# Patient Record
Sex: Female | Born: 1980 | Race: White | Hispanic: No | Marital: Married | Smoking: Never smoker
Health system: Southern US, Community
[De-identification: ages and names within clinical notes are randomized; demographics above are authoritative.]

## PROBLEM LIST (undated history)

## (undated) DIAGNOSIS — K219 Gastro-esophageal reflux disease without esophagitis: Secondary | ICD-10-CM

## (undated) DIAGNOSIS — K589 Irritable bowel syndrome without diarrhea: Secondary | ICD-10-CM

## (undated) HISTORY — DX: Gastro-esophageal reflux disease without esophagitis: K21.9

## (undated) HISTORY — PX: MANDIBLE SURGERY: SHX707

## (undated) HISTORY — PX: INTESTINAL MALROTATION REPAIR: SHX411

## (undated) HISTORY — PX: KNEE SURGERY: SHX244

---

## 2009-03-08 ENCOUNTER — Encounter: Admission: RE | Admit: 2009-03-08 | Discharge: 2009-03-08 | Payer: Self-pay | Admitting: Obstetrics and Gynecology

## 2010-02-16 ENCOUNTER — Inpatient Hospital Stay (HOSPITAL_COMMUNITY): Admission: AD | Admit: 2010-02-16 | Payer: Self-pay | Source: Home / Self Care | Admitting: Obstetrics and Gynecology

## 2010-03-08 ENCOUNTER — Telehealth (INDEPENDENT_AMBULATORY_CARE_PROVIDER_SITE_OTHER): Payer: Self-pay | Admitting: *Deleted

## 2010-03-08 ENCOUNTER — Ambulatory Visit: Payer: Commercial Indemnity | Admitting: Family Medicine

## 2010-03-08 ENCOUNTER — Ambulatory Visit (INDEPENDENT_AMBULATORY_CARE_PROVIDER_SITE_OTHER): Payer: Commercial Indemnity | Admitting: Emergency Medicine

## 2010-03-08 ENCOUNTER — Encounter: Payer: Self-pay | Admitting: Emergency Medicine

## 2010-03-08 DIAGNOSIS — M25569 Pain in unspecified knee: Secondary | ICD-10-CM | POA: Insufficient documentation

## 2010-03-10 ENCOUNTER — Encounter (HOSPITAL_COMMUNITY)
Admission: RE | Admit: 2010-03-10 | Discharge: 2010-03-10 | Disposition: A | Discharge status: Home / Self Care | Payer: Managed Care, Other (non HMO) | Source: Ambulatory Visit | Attending: Orthopedic Surgery | Admitting: Orthopedic Surgery

## 2010-03-10 DIAGNOSIS — Z01812 Encounter for preprocedural laboratory examination: Secondary | ICD-10-CM | POA: Insufficient documentation

## 2010-03-10 LAB — CBC
HCT: 31.6 % — ABNORMAL LOW (ref 36.0–46.0)
Hemoglobin: 10.8 g/dL — ABNORMAL LOW (ref 12.0–15.0)
MCH: 32.1 pg (ref 26.0–34.0)
MCV: 94 fL (ref 78.0–100.0)
Platelets: 196 10*3/uL (ref 150–400)
RBC: 3.36 MIL/uL — ABNORMAL LOW (ref 3.87–5.11)
WBC: 9.4 10*3/uL (ref 4.0–10.5)

## 2010-03-11 ENCOUNTER — Ambulatory Visit (HOSPITAL_COMMUNITY)
Admission: RE | Admit: 2010-03-11 | Discharge: 2010-03-11 | Disposition: A | Discharge status: Home / Self Care | Payer: Managed Care, Other (non HMO) | Source: Ambulatory Visit | Attending: Orthopedic Surgery | Admitting: Orthopedic Surgery

## 2010-03-11 ENCOUNTER — Ambulatory Visit (HOSPITAL_COMMUNITY)
Admission: RE | Admit: 2010-03-11 | Payer: Commercial Indemnity | Source: Ambulatory Visit | Admitting: Orthopedic Surgery

## 2010-03-11 DIAGNOSIS — M2240 Chondromalacia patellae, unspecified knee: Secondary | ICD-10-CM | POA: Insufficient documentation

## 2010-03-11 DIAGNOSIS — S83259A Bucket-handle tear of lateral meniscus, current injury, unspecified knee, initial encounter: Secondary | ICD-10-CM | POA: Insufficient documentation

## 2010-03-11 DIAGNOSIS — O99891 Other specified diseases and conditions complicating pregnancy: Secondary | ICD-10-CM | POA: Insufficient documentation

## 2010-03-15 NOTE — Progress Notes (Signed)
  Phone Note Outgoing Call   Call placed by: Lajean Saver RN,  March 08, 2010 11:44 AM Call placed to: Sports Med Summary of Call: Appointment made for today at 3pm with Dr. Pearletha Forge.

## 2010-03-15 NOTE — Assessment & Plan Note (Signed)
Summary: LEFT KNEE INJURY?NH (rm 4)   Vital Signs:  Patient Profile:   30 Years Old Female CC:      left knee pain x this AM Height:     66 inches Weight:      145 pounds O2 Sat:      100 % O2 treatment:    Room Air Temp:     97.8 degrees F oral Pulse rate:   92 / minute Resp:     16 per minute BP sitting:   90 / 60  (right arm) Cuff size:   regular  Pt. in pain?   yes    Location:   left knee    Intensity:   7    Type:       sharp  Vitals Entered By: Burnard Hawthorne RN (March 08, 2010 10:08 AM)                   Updated Prior Medication List: ACIPHEX 20 MG TBEC (RABEPRAZOLE SODIUM) once daily * PROBIOTIC   Current Allergies: ! * MINOCYCLINEHistory of Present Illness History from: patient Chief Complaint: left knee pain x this AM History of Present Illness: L knee injury this morning.  Was doing a lunging stretch with the L leg foreward and bent.  She feels like the knee "popped out of place".  This has occurred in the past but this time is worse.  Some popping and tightness, no locking or giving way.  No swelling or bruising.  Not using any OTC meds.  She did arrive in a wheelchair. *She is 70mo pregnant and will not get an Xray.  REVIEW OF SYSTEMS Constitutional Symptoms      Denies fever, chills, night sweats, weight loss, weight gain, and fatigue.  Eyes       Denies change in vision, eye pain, eye discharge, glasses, contact lenses, and eye surgery. Ear/Nose/Throat/Mouth       Denies hearing loss/aids, change in hearing, ear pain, ear discharge, dizziness, frequent runny nose, frequent nose bleeds, sinus problems, sore throat, hoarseness, and tooth pain or bleeding.  Respiratory       Denies dry cough, productive cough, wheezing, shortness of breath, asthma, bronchitis, and emphysema/COPD.  Cardiovascular       Denies murmurs, chest pain, and tires easily with exhertion.    Gastrointestinal       Denies stomach pain, nausea/vomiting, diarrhea, constipation,  blood in bowel movements, and indigestion. Genitourniary       Denies painful urination, kidney stones, and loss of urinary control. Neurological       Denies paralysis, seizures, and fainting/blackouts. Musculoskeletal       Complains of joint pain, decreased range of motion, and redness.      Denies muscle pain, joint stiffness, swelling, muscle weakness, and gout.      Comments: left knee Skin       Denies bruising, unusual mles/lumps or sores, and hair/skin or nail changes.  Psych       Denies mood changes, temper/anger issues, anxiety/stress, speech problems, depression, and sleep problems. Other Comments: Patient was stretching this AM with her left knee bent when she felt it "pop" with sudden pain.    Past History:  Past Medical History: 6 months gestation  Past Surgical History: Jaw Sx 2005 Stomach Surgery 2006 ([facility])  Family History: Family History Diabetes 1st degree relative- father  Social History: Never Smoked Alcohol use-no Drug use-no Smoking Status:  never Drug Use:  no Physical Exam General  appearance: well developed, well nourished, mild distress, sitting in wheelchair MSE: oriented to time, place, and person L knee: ROM is difficult to assess due to her pain tolerance, no effusion, no ecchymoses, Lachmans normal, Anterior & posterior drawer normal, McMurrays cannot be asseses, Varus & valgus stress normal.  Patella freely mobile, Clarks compression test normal.  Good alignment. No joint line tenderness.  There is some lateral patellar facet tenderness as well as lateral femoral condyle tenderness. Assessment New Problems: KNEE PAIN (ICD-719.46) FAMILY HISTORY DIABETES 1ST DEGREE RELATIVE (ICD-V18.0)   Plan New Orders: New Patient Level III [99203] Knee Immobilizer any size [L1830] Planning Comments:   Cannot Xray due to pregnancy.  Exam is limited due to patient cooperation.  However, I feel that she subluxed her patella and by doing so, has a  patellar and femoral contusion.  I placed her in a straight leg brace today and sent her directly to Dr. Pearletha Forge for evaluation.  Will likely need PT + NSAIDs + patellar brace with lateral support.  Ice, rest, and elevation until then.   The patient and/or caregiver has been counseled thoroughly with regard to medications prescribed including dosage, schedule, interactions, rationale for use, and possible side effects and they verbalize understanding.  Diagnoses and expected course of recovery discussed and will return if not improved as expected or if the condition worsens. Patient and/or caregiver verbalized understanding.   Orders Added: 1)  New Patient Level III [16109] 2)  Knee Immobilizer any size [L1830]

## 2010-03-24 NOTE — Op Note (Signed)
Rachael Dean, Rachael Dean             ACCOUNT NO.:  [id number]  MEDICAL RECORD NO.:  [id number]           PATIENT TYPE:  O  LOCATION:  [facility]                         FACILITY:  [facility]  PHYSICIAN:  Harvie Junior, M.D.   DATE OF BIRTH:  [birth year]  DATE OF PROCEDURE:  03/11/2010 DATE OF DISCHARGE:  03/11/2010                              OPERATIVE REPORT   PREOPERATIVE DIAGNOSIS:  Lateral bucket-handle meniscal tear.  POSTOPERATIVE DIAGNOSES: 1. Lateral bucket-handle meniscal tear. 2. Chondromalacia of patella.  PRINCIPAL PROCEDURE: 1. Partial lateral meniscectomy with debridement of lateral bucket-     handle tear. 2. Debridement of chondromalacia of patella.  SURGEON:  Harvie Junior, MD  ASSISTANT:  Marshia Ly, PA  ANESTHESIA:  General.  BRIEF HISTORY:  Ms. Lienhard is a 30 year old female with long history of having had a twisting injury to her knee where she felt the knee locking and catch.  She was unable to move the knee following this.  Because of continued complaints of pain, ultrasound was obtained which showed she had a displaced bucket-handle tear.  Once that was obtained, we talked to her about treatment options including observation.  She had inability to stand, inability to walk.  Because of continued complaints of severe and significant pain, ultimately she was taken to the operating room for operative knee arthroscopy.  She was [redacted] weeks pregnant, so we talked her obstetrician about this, we talked anesthesia and had a protocol they ran.  We discussed waiting.  Because of high level of misery, we felt that she needed to undergo this surgical procedure.  She was brought to the operating room for this procedure.  PROCEDURE:  The patient was brought to the operating room.  After adequate anesthesia was obtained with general anesthetic, the patient was placed supine on the operating room table.  The left leg was prepped and draped in the usual sterile fashion.   Following this, routine arthroscopic examination of the knee revealed there was a little bit of chondromalacia of patella which was debrided back to smooth and stable rim.  Attention was then turned to the medial compartment which was normal.  ACL was normal.  Lateral compartment showed a lateral bucket- handle tear.  We could move this back into a reduced position.  There was really no significant blood on the back piece and I think it had been more of a chronic tear.  There were some portions of the meniscus which were sort of bead up.  At that point, we ultimately elected for meniscal resection.  We took a straight biter and straight back to the back portion of the meniscus and then at the mid body straight back at that point and then removed that piece in total.  Remaining meniscal rim was contoured down with a suction shaver.  At this point, the knee was milked in the back to remove all meniscal fragments.  The knee was now unlocked.  The knee moved freely at this point.  At this point, the attention was turned back in the lateral gutter and up and around the patellofemoral joint.  The knee was then copiously  and thoroughly lavaged, suctioned dried.  Arthroscopic portals were closed with bandage and a small stitch on the lateral side.  The patient was then taken to recovery room and was noted to be in satisfactory condition.  Estimated blood loss for the procedure was none.     Harvie Junior, M.D.     Ranae Plumber  D:  03/11/2010  T:  03/12/2010  Job:  [id number]  Electronically Signed by Jodi Geralds M.D. on 03/23/2010 01:28:15 PM

## 2010-06-29 ENCOUNTER — Inpatient Hospital Stay (HOSPITAL_COMMUNITY): Admission: AD | Admit: 2010-06-29 | Payer: Self-pay | Source: Ambulatory Visit | Admitting: Obstetrics and Gynecology

## 2010-07-04 ENCOUNTER — Encounter (HOSPITAL_COMMUNITY): Payer: Self-pay | Admitting: *Deleted

## 2010-07-04 ENCOUNTER — Inpatient Hospital Stay (HOSPITAL_COMMUNITY)
Admission: AD | Admit: 2010-07-04 | Discharge: 2010-07-07 | DRG: 775 | Disposition: A | Discharge status: Home / Self Care | Payer: Managed Care, Other (non HMO) | Source: Ambulatory Visit | Attending: Obstetrics and Gynecology | Admitting: Obstetrics and Gynecology

## 2010-07-04 DIAGNOSIS — O480 Post-term pregnancy: Principal | ICD-10-CM | POA: Diagnosis present

## 2010-07-04 LAB — CBC
MCV: 93.6 fL (ref 78.0–100.0)
Platelets: 206 10*3/uL (ref 150–400)
RDW: 13.5 % (ref 11.5–15.5)

## 2010-07-05 LAB — RPR: RPR Ser Ql: NONREACTIVE

## 2010-07-06 LAB — ABO/RH: ABO/RH(D): O POS

## 2010-07-07 LAB — CBC
MCH: 32.2 pg (ref 26.0–34.0)
RBC: 3.45 MIL/uL — ABNORMAL LOW (ref 3.87–5.11)
WBC: 14.9 10*3/uL — ABNORMAL HIGH (ref 4.0–10.5)

## 2012-11-01 LAB — OB RESULTS CONSOLE ABO/RH: RH Type: POSITIVE

## 2012-11-01 LAB — OB RESULTS CONSOLE RPR: RPR: NONREACTIVE

## 2012-11-01 LAB — OB RESULTS CONSOLE HEPATITIS B SURFACE ANTIGEN: Hepatitis B Surface Ag: NEGATIVE

## 2012-11-01 LAB — OB RESULTS CONSOLE GC/CHLAMYDIA
CHLAMYDIA, DNA PROBE: NEGATIVE
Gonorrhea: NEGATIVE

## 2012-11-01 LAB — OB RESULTS CONSOLE RUBELLA ANTIBODY, IGM: Rubella: IMMUNE

## 2012-11-01 LAB — OB RESULTS CONSOLE ANTIBODY SCREEN: ANTIBODY SCREEN: NEGATIVE

## 2012-11-01 LAB — OB RESULTS CONSOLE HIV ANTIBODY (ROUTINE TESTING): HIV: NONREACTIVE

## 2013-01-16 NOTE — L&D Delivery Note (Signed)
Delivery Note At 12:49 PM a viable and healthy female was delivered via Vaginal, Spontaneous Delivery (Presentation: Right Occiput Anterior).  APGAR: 8, 9; weight .  pending Placenta status: Intact, Spontaneous. Not sent Cord:  with the following complications: None.  Cord pH: none  Anesthesia: Epidural Local  Episiotomy: None Lacerations: 1st degree;Perineal;Labial majus( perineal area overall mildly avulsed from vagina) Suture Repair: 3.0 chromic Est. Blood Loss (mL): 250  Mom to postpartum.  Baby to Couplet care / Skin to Skin.  Honore Wipperfurth A Rickia Freeburg 06/01/2013, 1:16 PM

## 2013-02-20 ENCOUNTER — Encounter (HOSPITAL_COMMUNITY): Payer: Self-pay | Admitting: Emergency Medicine

## 2013-02-20 ENCOUNTER — Emergency Department (HOSPITAL_COMMUNITY)
Admission: EM | Admit: 2013-02-20 | Discharge: 2013-02-20 | Disposition: A | Discharge status: Home / Self Care | Payer: BC Managed Care – PPO | Attending: Emergency Medicine | Admitting: Emergency Medicine

## 2013-02-20 ENCOUNTER — Emergency Department (HOSPITAL_COMMUNITY): Payer: BC Managed Care – PPO

## 2013-02-20 DIAGNOSIS — J40 Bronchitis, not specified as acute or chronic: Secondary | ICD-10-CM

## 2013-02-20 DIAGNOSIS — O9989 Other specified diseases and conditions complicating pregnancy, childbirth and the puerperium: Secondary | ICD-10-CM | POA: Insufficient documentation

## 2013-02-20 DIAGNOSIS — Z87891 Personal history of nicotine dependence: Secondary | ICD-10-CM | POA: Insufficient documentation

## 2013-02-20 DIAGNOSIS — R079 Chest pain, unspecified: Secondary | ICD-10-CM | POA: Insufficient documentation

## 2013-02-20 MED ORDER — BENZONATATE 100 MG PO CAPS: 100.0000 mg | ORAL_CAPSULE | Freq: Two times a day (BID) | ORAL | Status: DC | PRN

## 2013-02-20 MED ORDER — AEROCHAMBER Z-STAT PLUS/MEDIUM MISC
1.0000 | Freq: Once | Status: AC
  Administered 2013-02-20: 1
  Filled 2013-02-20: qty 1

## 2013-02-20 MED ORDER — PREDNISONE 20 MG PO TABS
60.0000 mg | ORAL_TABLET | Freq: Once | ORAL | Status: AC
  Administered 2013-02-20: 60 mg via ORAL
  Filled 2013-02-20: qty 3

## 2013-02-20 MED ORDER — PREDNISONE 20 MG PO TABS: 40.0000 mg | ORAL_TABLET | Freq: Every day | ORAL | Status: DC

## 2013-02-20 MED ORDER — ALBUTEROL SULFATE HFA 108 (90 BASE) MCG/ACT IN AERS
2.0000 | INHALATION_SPRAY | RESPIRATORY_TRACT | Status: DC | PRN
  Administered 2013-02-20: 2 via RESPIRATORY_TRACT
  Filled 2013-02-20: qty 6.7

## 2013-02-20 MED ORDER — BENZONATATE 100 MG PO CAPS
200.0000 mg | ORAL_CAPSULE | Freq: Once | ORAL | Status: AC
  Administered 2013-02-20: 200 mg via ORAL
  Filled 2013-02-20: qty 2

## 2013-02-20 NOTE — ED Notes (Signed)
Pt has had cough for 4 weeks.  Pt started on antibiotic on Friday.  Pt feels better but cough remains.  Unknown for fever.  Pt states generalized body aches at home.  Dry cough.

## 2013-02-20 NOTE — ED Provider Notes (Signed)
  Face-to-face evaluation   History: She's been on for 4 weeks with a nonproductive cough. She's never been a smoker, her current pregnancy is uncomplicated.  Physical exam: Alert, calm, occasional cough. Lungs have somewhat decreased air movement bilaterally, but notable wheezes, rales, or rhonchi. Chest wall is mildly tender left anterior.  Medical screening examination/treatment/procedure(s) were conducted as a shared visit with non-physician practitioner(s) and myself.  I personally evaluated the patient during the encounter  Flint MelterElliott L Doria Fern, MD 02/22/13 [phone]

## 2013-02-20 NOTE — ED Notes (Signed)
Pt being sent by Cherly Hensenousins MD for coughing and chest pain x "over 4 weeks."  MD's nurse sts "Pt sounds junky.  The cough is causing chest pain and she is incontinent when she coughs."  Pt is [redacted]wks pregnant.

## 2013-02-20 NOTE — ED Notes (Signed)
Positive fetal heart tones. Unable to count due to doppler available in the ED. EDPA made aware.

## 2013-02-20 NOTE — Discharge Instructions (Signed)
Take 2 puffs of albuterol every 4 hours for cough  Take prednisone (steroid) for 5 days  Take Tessalon twice daily as needed for cough  Follow-up with your doctor on Monday  Return to the emergency department if you develop any changing/worsening condition, chest pain, difficulty breathing, coughing up blood, leg swelling, abdominal pain, vaginal bleeding, or any other concerns (please read additional information regarding your condition below)   Bronchitis Bronchitis is inflammation of the airways that extend from the windpipe into the lungs (bronchi). The inflammation often causes mucus to develop, which leads to a cough. If the inflammation becomes severe, it may cause shortness of breath. CAUSES  Bronchitis may be caused by:   Viral infections.   Bacteria.   Cigarette smoke.   Allergens, pollutants, and other irritants.  SIGNS AND SYMPTOMS  The most common symptom of bronchitis is a frequent cough that produces mucus. Other symptoms include:  Fever.   Body aches.   Chest congestion.   Chills.   Shortness of breath.   Sore throat.  DIAGNOSIS  Bronchitis is usually diagnosed through a medical history and physical exam. Tests, such as chest X-rays, are sometimes done to rule out other conditions.  TREATMENT  You may need to avoid contact with whatever caused the problem (smoking, for example). Medicines are sometimes needed. These may include:  Antibiotics. These may be prescribed if the condition is caused by bacteria.  Cough suppressants. These may be prescribed for relief of cough symptoms.   Inhaled medicines. These may be prescribed to help open your airways and make it easier for you to breathe.   Steroid medicines. These may be prescribed for those with recurrent (chronic) bronchitis. HOME CARE INSTRUCTIONS  Get plenty of rest.   Drink enough fluids to keep your urine clear or pale yellow (unless you have a medical condition that requires fluid  restriction). Increasing fluids may help thin your secretions and will prevent dehydration.   Only take over-the-counter or prescription medicines as directed by your health care provider.  Only take antibiotics as directed. Make sure you finish them even if you start to feel better.  Avoid secondhand smoke, irritating chemicals, and strong fumes. These will make bronchitis worse. If you are a smoker, quit smoking. Consider using nicotine gum or skin patches to help control withdrawal symptoms. Quitting smoking will help your lungs heal faster.   Put a cool-mist humidifier in your bedroom at night to moisten the air. This may help loosen mucus. Change the water in the humidifier daily. You can also run the hot water in your shower and sit in the bathroom with the door closed for 5 10 minutes.   Follow up with your health care provider as directed.   Wash your hands frequently to avoid catching bronchitis again or spreading an infection to others.  SEEK MEDICAL CARE IF: Your symptoms do not improve after 1 week of treatment.  SEEK IMMEDIATE MEDICAL CARE IF:  Your fever increases.  You have chills.   You have chest pain.   You have worsening shortness of breath.   You have bloody sputum.  You faint.  You have lightheadedness.  You have a severe headache.   You vomit repeatedly. MAKE SURE YOU:   Understand these instructions.  Will watch your condition.  Will get help right away if you are not doing well or get worse. Document Released: 01/02/2005 Document Revised: 10/23/2012 Document Reviewed: 08/27/2012 [facility] Patient Information 2014 [place], [place].

## 2013-02-20 NOTE — ED Provider Notes (Signed)
CSN: [id number]     Arrival date & time 02/20/13  1529 History   First MD Initiated Contact with Patient 02/20/13 1635     Chief Complaint  Patient presents with  . Cough    HPI  Rachael Dean is a 32 y.o. female with no PMH who presents to the ED for evaluation of cough.  History was provided by the patient. Patient states that she has had a cough for the past 4 weeks. Her cough is non-productive. She has been on an antibiotics amoxicillin for the past 6 days. She also took one dose of Mucinex to help treat her symptoms with no relief. She states she had chills, subjective fever, myalgias, and fatigue which has improved since starting her antibiotics. She continues to have rhinorrhea and nasal congestion. She denies any SOB, wheezing and dyspnea. Patient complains of lower rib pain which is worse with coughing, deep breathing, and movement. Patient denies any chest pain at rest. She states it started on the right and moved to the left, where it is now present. Patient sent by OB/GYN Dr. Cherly Hensen for evaluation from clinic after being seen today. Patient is currently [redacted] weeks pregnant. Patient denies any leg swelling, abdominal pain, vaginal bleeding/discharge. She denies any complications with her pregnancy. No hx of DVT/PE.    History reviewed. No pertinent past medical history. Past Surgical History  Procedure Laterality Date  . Mandible surgery    . Intestinal malrotation repair    . Knee surgery     History reviewed. No pertinent family history. History  Substance Use Topics  . Smoking status: Former Games developer  . Smokeless tobacco: Not on file  . Alcohol Use: No     Comment: pregnant   OB History   Grav Para Term Preterm Abortions TAB SAB Ect Mult Living   1              Review of Systems  Constitutional: Positive for fever (subjective - resolved), chills (resolved) and fatigue (resolved). Negative for diaphoresis, activity change and appetite change.  HENT: Positive for  congestion and rhinorrhea. Negative for ear pain and sore throat.   Respiratory: Positive for cough. Negative for shortness of breath and wheezing.   Cardiovascular: Positive for chest pain (rib pain). Negative for leg swelling.  Gastrointestinal: Negative for nausea, vomiting and abdominal pain.  Genitourinary: Negative for dysuria, vaginal bleeding, vaginal discharge and vaginal pain.  Musculoskeletal: Positive for myalgias (resolved).  Neurological: Negative for dizziness, syncope, weakness and light-headedness.    Allergies  Minocycline  Home Medications   Current Outpatient Rx  Name  Route  Sig  Dispense  Refill  . acetaminophen (TYLENOL) 500 MG tablet   Oral   Take 1,000 mg by mouth every 6 (six) hours as needed for moderate pain.         Marland Kitchen amoxicillin (AMOXIL) 500 MG capsule   Oral   Take 500 mg by mouth 3 (three) times daily. Started 02/14/13 for 10 days         . Docosahexaenoic Acid (DHA PO)   Oral   Take 2 tablets by mouth daily.         . Prenatal Vit-Fe Fumarate-FA (PRENATAL MULTIVITAMIN) TABS tablet   Oral   Take 1 tablet by mouth daily at 12 noon.         . Probiotic Product (PROBIOTIC DAILY PO)   Oral   Take 2 capsules by mouth daily.  BP 102/63  Pulse 91  Temp(Src) 97.7 F (36.5 C) (Oral)  Resp 20  SpO2 98%  LMP 08/16/2012  Filed Vitals:   02/20/13 1536 02/20/13 1850  BP: 102/63 92/55  Pulse: 91 82  Temp: 97.7 F (36.5 C)   TempSrc: Oral   Resp: 20 20  SpO2: 98% 96%    Physical Exam  Nursing note and vitals reviewed. Constitutional: She is oriented to person, place, and time. She appears well-developed and well-nourished. No distress.  HENT:  Head: Normocephalic and atraumatic.  Right Ear: External ear normal.  Left Ear: External ear normal.  Nose: Nose normal.  Mouth/Throat: Oropharynx is clear and moist. No oropharyngeal exudate.  Nasal congestion. Tympanic membranes gray and translucent bilaterally with no erythema,  edema, or hemotympanum. No erythema to the posterior pharynx. Tonsils without edema or exudates. Uvula midline. No trismus. No difficulty controlling secretions.   Eyes: Conjunctivae are normal. Pupils are equal, round, and reactive to light. Right eye exhibits no discharge. Left eye exhibits no discharge.  Neck: Normal range of motion. Neck supple.  Cardiovascular: Normal rate, regular rhythm, normal heart sounds and intact distal pulses.  Exam reveals no gallop and no friction rub.   No murmur heard. Dorsalis pedis pulses present and equal bilaterally  Pulmonary/Chest: Effort normal and breath sounds normal. No respiratory distress. She has no wheezes. She has no rales. She exhibits tenderness.    Patient coughing throughout exam. Tenderness to palpation to the middle and lower anterior ribs throughout.  Abdominal: Soft. Bowel sounds are normal. She exhibits no distension and no mass. There is no tenderness. There is no rebound and no guarding.  Protuberant abdomen  Musculoskeletal: Normal range of motion. She exhibits no edema and no tenderness.  No LE edema or calf tenderness bilaterally.   Neurological: She is alert and oriented to person, place, and time.  Skin: Skin is warm and dry. She is not diaphoretic.    ED Course  Procedures (including critical care time) Labs Review Labs Reviewed - No data to display Imaging Review No results found.  EKG Interpretation   None       DG Chest 2 View (Final result)  Result time: 02/20/13 18:09:12    Final result by Rad Results In Interface (02/20/13 18:09:12)    Narrative:   CLINICAL DATA: Cough. Pregnancy.  EXAM: CHEST 2 VIEW  COMPARISON: None.  FINDINGS: The abdomen was triple shielded due to known pregnancy.  The lungs appear clear. Cardiac and mediastinal contours normal. No pleural effusion identified.  IMPRESSION: No active cardiopulmonary disease.   Electronically Signed By: Herbie BaltimoreWalt Liebkemann M.D. On: 02/20/2013  18:09    MDM   Delora FuelJennifer Benish is a 33 y.o. female with no PMH who presents to the ED for evaluation of cough.   Rechecks  6:30 PM = Patient states she feels much better after tessalon.    Etiology of symptoms likely due to bronchitis. Chest x-ray negative for an acute cardiopulmonary process. Patient afebrile and non-toxic in appearance. No evidence of DVT. Low suspicion for pulmonary embolism. No hypoxia or tachycardia. Chest pain likely musculoskeletal in nature from coughing and is reproducible on exam. Patient had improvements in her symptoms with Tessalon. Patient given albuterol inhaler in the ED as well as prednisone. Patient instructed to continue albuterol treatments, prednisone, and tessalon. Fetal heart tones present and taken at clinic prior to arrival in the ED. Patient instructed to follow-up with OB/GYN and PCP. Return precautions, discharge instructions, and follow-up was discussed  with the patient before discharge.    Discharge Medication List as of 02/20/2013  6:38 PM    START taking these medications   Details  benzonatate (TESSALON PERLES) 100 MG capsule Take 1 capsule (100 mg total) by mouth 2 (two) times daily as needed for cough., Starting 02/20/2013, Until Discontinued, Print    predniSONE (DELTASONE) 20 MG tablet Take 2 tablets (40 mg total) by mouth daily., Starting 02/20/2013, Until Discontinued, Print        Final impressions: 1. Bronchitis       Luiz Iron PA-C   This patient was discussed with Dr. Arloa Koh, PA-C 02/21/13 1028

## 2013-04-22 LAB — OB RESULTS CONSOLE GBS: GBS: NEGATIVE

## 2013-05-28 ENCOUNTER — Other Ambulatory Visit: Payer: Self-pay | Admitting: Obstetrics and Gynecology

## 2013-05-29 ENCOUNTER — Telehealth (HOSPITAL_COMMUNITY): Payer: Self-pay | Admitting: *Deleted

## 2013-05-29 ENCOUNTER — Encounter (HOSPITAL_COMMUNITY): Payer: Self-pay | Admitting: *Deleted

## 2013-05-29 NOTE — Telephone Encounter (Signed)
Preadmission screen  

## 2013-05-31 ENCOUNTER — Encounter (HOSPITAL_COMMUNITY): Payer: Self-pay

## 2013-05-31 ENCOUNTER — Inpatient Hospital Stay (HOSPITAL_COMMUNITY)
Admission: RE | Admit: 2013-05-31 | Discharge: 2013-06-03 | DRG: 775 | Disposition: A | Discharge status: Home / Self Care | Payer: BC Managed Care – PPO | Source: Ambulatory Visit | Attending: Obstetrics and Gynecology | Admitting: Obstetrics and Gynecology

## 2013-05-31 DIAGNOSIS — K219 Gastro-esophageal reflux disease without esophagitis: Secondary | ICD-10-CM | POA: Diagnosis present

## 2013-05-31 DIAGNOSIS — O480 Post-term pregnancy: Principal | ICD-10-CM | POA: Diagnosis present

## 2013-05-31 DIAGNOSIS — O09529 Supervision of elderly multigravida, unspecified trimester: Secondary | ICD-10-CM | POA: Diagnosis present

## 2013-05-31 DIAGNOSIS — Z87891 Personal history of nicotine dependence: Secondary | ICD-10-CM

## 2013-05-31 DIAGNOSIS — Z833 Family history of diabetes mellitus: Secondary | ICD-10-CM

## 2013-05-31 DIAGNOSIS — K589 Irritable bowel syndrome without diarrhea: Secondary | ICD-10-CM | POA: Diagnosis present

## 2013-05-31 HISTORY — DX: Irritable bowel syndrome, unspecified: K58.9

## 2013-05-31 LAB — CBC
HEMATOCRIT: 34.2 % — AB (ref 36.0–46.0)
Hemoglobin: 11.8 g/dL — ABNORMAL LOW (ref 12.0–15.0)
MCH: 31.5 pg (ref 26.0–34.0)
MCHC: 34.5 g/dL (ref 30.0–36.0)
MCV: 91.2 fL (ref 78.0–100.0)
PLATELETS: 159 10*3/uL (ref 150–400)
RBC: 3.75 MIL/uL — ABNORMAL LOW (ref 3.87–5.11)
RDW: 13.2 % (ref 11.5–15.5)
WBC: 9.1 10*3/uL (ref 4.0–10.5)

## 2013-05-31 MED ORDER — LIDOCAINE HCL (PF) 1 % IJ SOLN
30.0000 mL | INTRAMUSCULAR | Status: AC | PRN
  Administered 2013-06-01: 30 mL via SUBCUTANEOUS
  Filled 2013-05-31: qty 30

## 2013-05-31 MED ORDER — LACTATED RINGERS IV SOLN
INTRAVENOUS | Status: DC
  Administered 2013-05-31 – 2013-06-01 (×2): via INTRAVENOUS

## 2013-05-31 MED ORDER — MISOPROSTOL 25 MCG QUARTER TABLET
25.0000 ug | ORAL_TABLET | ORAL | Status: DC | PRN
  Administered 2013-05-31: 25 ug via VAGINAL
  Filled 2013-05-31: qty 1
  Filled 2013-05-31: qty 0.25

## 2013-05-31 MED ORDER — OXYCODONE-ACETAMINOPHEN 5-325 MG PO TABS: 1.0000 | ORAL_TABLET | ORAL | Status: DC | PRN

## 2013-05-31 MED ORDER — ACETAMINOPHEN 325 MG PO TABS
650.0000 mg | ORAL_TABLET | ORAL | Status: DC | PRN
  Administered 2013-06-01 (×2): 650 mg via ORAL
  Filled 2013-05-31 (×2): qty 2

## 2013-05-31 MED ORDER — OXYTOCIN 10 UNIT/ML IJ SOLN: 10.0000 [IU] | Freq: Once | INTRAMUSCULAR | Status: DC

## 2013-05-31 MED ORDER — CITRIC ACID-SODIUM CITRATE 334-500 MG/5ML PO SOLN: 30.0000 mL | ORAL | Status: DC | PRN

## 2013-05-31 MED ORDER — LACTATED RINGERS IV SOLN: 500.0000 mL | INTRAVENOUS | Status: DC | PRN

## 2013-05-31 MED ORDER — ONDANSETRON HCL 4 MG/2ML IJ SOLN
4.0000 mg | Freq: Four times a day (QID) | INTRAMUSCULAR | Status: DC | PRN
  Administered 2013-06-01: 4 mg via INTRAVENOUS
  Filled 2013-05-31: qty 2

## 2013-05-31 MED ORDER — OXYTOCIN BOLUS FROM INFUSION
500.0000 mL | INTRAVENOUS | Status: DC
  Administered 2013-06-01: 500 mL via INTRAVENOUS

## 2013-05-31 MED ORDER — BUTORPHANOL TARTRATE 1 MG/ML IJ SOLN
1.0000 mg | INTRAMUSCULAR | Status: DC | PRN
  Administered 2013-06-01: 1 mg via INTRAVENOUS
  Filled 2013-05-31: qty 1

## 2013-05-31 MED ORDER — TERBUTALINE SULFATE 1 MG/ML IJ SOLN: 0.2500 mg | Freq: Once | INTRAMUSCULAR | Status: AC | PRN

## 2013-05-31 MED ORDER — IBUPROFEN 600 MG PO TABS: 600.0000 mg | ORAL_TABLET | Freq: Four times a day (QID) | ORAL | Status: DC | PRN

## 2013-05-31 MED ORDER — ZOLPIDEM TARTRATE 5 MG PO TABS
5.0000 mg | ORAL_TABLET | Freq: Every evening | ORAL | Status: DC | PRN
Start: 2013-05-31 — End: 2013-06-01
  Administered 2013-05-31: 5 mg via ORAL
  Filled 2013-05-31: qty 1

## 2013-05-31 MED ORDER — OXYTOCIN 40 UNITS IN LACTATED RINGERS INFUSION - SIMPLE MED: 62.5000 mL/h | INTRAVENOUS | Status: DC

## 2013-05-31 NOTE — H&P (Signed)
Rachael Dean is a 33 y.o. female presenting for IOL @ 40 4/[redacted] weeks gestation 2nd to postdates. Uncomplicated prenatal course. History OB History   Grav Para Term Preterm Abortions TAB SAB Ect Mult Living   2 1 1       1      Past Medical History  Diagnosis Date  . GERD (gastroesophageal reflux disease)   . IBS (irritable bowel syndrome)    Past Surgical History  Procedure Laterality Date  . Mandible surgery    . Intestinal malrotation repair    . Knee surgery     Family History: family history includes Birth defects in her paternal aunt; Diabetes in her father; Hypertension in her paternal grandmother. Social History:  reports that she quit smoking about 6 years ago. She does not have any smokeless tobacco history on file. She reports that she does not drink alcohol or use illicit drugs.   Prenatal Transfer Tool  Maternal Diabetes: No Genetic Screening: Normal Maternal Ultrasounds/Referrals: Normal Fetal Ultrasounds or other Referrals:  None Maternal Substance Abuse:  No Significant Maternal Medications:  None Significant Maternal Lab Results:  Lab values include: Group B Strep negative Other Comments:  None  Review of Systems  All other systems reviewed and are negative.     Temperature 98.1 F (36.7 C), temperature source Oral, resp. rate 16, height 5\' 6"  (1.676 m), weight 73.029 kg (161 lb), last menstrual period 08/20/2012. Maternal Exam:  Abdomen: Fetal presentation: vertex  Introitus: Normal vulva.   Physical Exam  Constitutional: She is oriented to person, place, and time. She appears well-developed and well-nourished.  HENT:  Head: Atraumatic.  Eyes: EOM are normal.  Neck: Neck supple.  Respiratory: Effort normal.  GI: Soft.  Neurological: She is alert and oriented to person, place, and time.  Skin: Skin is warm and dry.  Psychiatric: She has a normal mood and affect.    Prenatal labs: ABO, Rh: O/Positive/-- (10/17 0000) Antibody: Negative (10/17  0000) Rubella: Immune (10/17 0000) RPR: Nonreactive (10/17 0000)  HBsAg: Negative (10/17 0000)  HIV: Non-reactive (10/17 0000)  GBS: Negative (04/07 0000)   Assessment/Plan: Postdates P) admit routine labs. Cytotec. Epidural prn. Amniotomy prn Pitocin in am   Chinedu Agustin A Rafik Koppel 05/31/2013, 11:34 PM

## 2013-06-01 ENCOUNTER — Inpatient Hospital Stay (HOSPITAL_COMMUNITY): Payer: BC Managed Care – PPO | Admitting: Anesthesiology

## 2013-06-01 ENCOUNTER — Encounter (HOSPITAL_COMMUNITY): Payer: BC Managed Care – PPO | Admitting: Anesthesiology

## 2013-06-01 ENCOUNTER — Encounter (HOSPITAL_COMMUNITY): Payer: Self-pay

## 2013-06-01 LAB — RPR

## 2013-06-01 MED ORDER — LANOLIN HYDROUS EX OINT: TOPICAL_OINTMENT | CUTANEOUS | Status: DC | PRN

## 2013-06-01 MED ORDER — PHENYLEPHRINE 40 MCG/ML (10ML) SYRINGE FOR IV PUSH (FOR BLOOD PRESSURE SUPPORT)
PREFILLED_SYRINGE | INTRAVENOUS | Status: AC
  Filled 2013-06-01: qty 10

## 2013-06-01 MED ORDER — PHENYLEPHRINE 40 MCG/ML (10ML) SYRINGE FOR IV PUSH (FOR BLOOD PRESSURE SUPPORT)
80.0000 ug | PREFILLED_SYRINGE | INTRAVENOUS | Status: DC | PRN
  Filled 2013-06-01: qty 2

## 2013-06-01 MED ORDER — TERBUTALINE SULFATE 1 MG/ML IJ SOLN
0.2500 mg | Freq: Once | INTRAMUSCULAR | Status: DC | PRN
Start: 2013-06-01 — End: 2013-06-01

## 2013-06-01 MED ORDER — DIPHENHYDRAMINE HCL 50 MG/ML IJ SOLN: 12.5000 mg | INTRAMUSCULAR | Status: DC | PRN

## 2013-06-01 MED ORDER — ONDANSETRON HCL 4 MG/2ML IJ SOLN: 4.0000 mg | INTRAMUSCULAR | Status: DC | PRN

## 2013-06-01 MED ORDER — DIBUCAINE 1 % RE OINT: 1.0000 "application " | TOPICAL_OINTMENT | RECTAL | Status: DC | PRN

## 2013-06-01 MED ORDER — DIPHENHYDRAMINE HCL 25 MG PO CAPS: 25.0000 mg | ORAL_CAPSULE | Freq: Four times a day (QID) | ORAL | Status: DC | PRN

## 2013-06-01 MED ORDER — EPHEDRINE 5 MG/ML INJ
10.0000 mg | INTRAVENOUS | Status: DC | PRN
  Filled 2013-06-01: qty 2

## 2013-06-01 MED ORDER — SIMETHICONE 80 MG PO CHEW: 80.0000 mg | CHEWABLE_TABLET | ORAL | Status: DC | PRN

## 2013-06-01 MED ORDER — SENNOSIDES-DOCUSATE SODIUM 8.6-50 MG PO TABS
2.0000 | ORAL_TABLET | ORAL | Status: DC
  Administered 2013-06-01 – 2013-06-02 (×2): 2 via ORAL
  Filled 2013-06-01 (×2): qty 2

## 2013-06-01 MED ORDER — LACTATED RINGERS IV SOLN
500.0000 mL | Freq: Once | INTRAVENOUS | Status: AC
  Administered 2013-06-01: 11:00:00 via INTRAVENOUS

## 2013-06-01 MED ORDER — BENZOCAINE-MENTHOL 20-0.5 % EX AERO
1.0000 "application " | INHALATION_SPRAY | CUTANEOUS | Status: DC | PRN
  Administered 2013-06-01: 1 via TOPICAL
  Filled 2013-06-01: qty 56

## 2013-06-01 MED ORDER — FENTANYL 2.5 MCG/ML BUPIVACAINE 1/10 % EPIDURAL INFUSION (WH - ANES)
14.0000 mL/h | INTRAMUSCULAR | Status: DC | PRN
  Administered 2013-06-01: 14 mL/h via EPIDURAL

## 2013-06-01 MED ORDER — PANTOPRAZOLE SODIUM 40 MG IV SOLR
40.0000 mg | INTRAVENOUS | Status: DC
  Filled 2013-06-01 (×2): qty 40

## 2013-06-01 MED ORDER — LIDOCAINE HCL (PF) 1 % IJ SOLN
INTRAMUSCULAR | Status: DC | PRN
  Administered 2013-06-01 (×2): 5 mL

## 2013-06-01 MED ORDER — WITCH HAZEL-GLYCERIN EX PADS: 1.0000 "application " | MEDICATED_PAD | CUTANEOUS | Status: DC | PRN

## 2013-06-01 MED ORDER — ONDANSETRON HCL 4 MG PO TABS: 4.0000 mg | ORAL_TABLET | ORAL | Status: DC | PRN

## 2013-06-01 MED ORDER — OXYTOCIN 40 UNITS IN LACTATED RINGERS INFUSION - SIMPLE MED
1.0000 m[IU]/min | INTRAVENOUS | Status: DC
  Administered 2013-06-01: 2 m[IU]/min via INTRAVENOUS
  Filled 2013-06-01: qty 1000

## 2013-06-01 MED ORDER — PRENATAL MULTIVITAMIN CH
1.0000 | ORAL_TABLET | Freq: Every day | ORAL | Status: DC
  Filled 2013-06-01: qty 1

## 2013-06-01 MED ORDER — OXYCODONE-ACETAMINOPHEN 5-325 MG PO TABS
1.0000 | ORAL_TABLET | ORAL | Status: DC | PRN
  Administered 2013-06-01: 1 via ORAL
  Administered 2013-06-02: 2 via ORAL
  Administered 2013-06-02 – 2013-06-03 (×5): 1 via ORAL
  Filled 2013-06-01: qty 1
  Filled 2013-06-01: qty 2
  Filled 2013-06-01 (×5): qty 1

## 2013-06-01 MED ORDER — ZOLPIDEM TARTRATE 5 MG PO TABS
5.0000 mg | ORAL_TABLET | Freq: Every evening | ORAL | Status: DC | PRN
Start: 2013-06-01 — End: 2013-06-03

## 2013-06-01 MED ORDER — EPHEDRINE 5 MG/ML INJ
INTRAVENOUS | Status: AC
  Filled 2013-06-01: qty 4

## 2013-06-01 MED ORDER — IBUPROFEN 600 MG PO TABS
600.0000 mg | ORAL_TABLET | Freq: Four times a day (QID) | ORAL | Status: DC
  Administered 2013-06-01 – 2013-06-03 (×7): 600 mg via ORAL
  Filled 2013-06-01 (×7): qty 1

## 2013-06-01 MED ORDER — FENTANYL 2.5 MCG/ML BUPIVACAINE 1/10 % EPIDURAL INFUSION (WH - ANES)
INTRAMUSCULAR | Status: AC
  Filled 2013-06-01: qty 125

## 2013-06-01 MED ORDER — FERROUS SULFATE 325 (65 FE) MG PO TABS
325.0000 mg | ORAL_TABLET | Freq: Two times a day (BID) | ORAL | Status: DC
  Filled 2013-06-01 (×3): qty 1

## 2013-06-01 NOTE — Anesthesia Procedure Notes (Signed)
Epidural Patient location during procedure: OB Start time: 06/01/2013 11:49 AM  Staffing Anesthesiologist: Brayton CavesJACKSON, Denijah Karrer Performed by: anesthesiologist   Preanesthetic Checklist Completed: patient identified, site marked, surgical consent, pre-op evaluation, timeout performed, IV checked, risks and benefits discussed and monitors and equipment checked  Epidural Patient position: sitting Prep: site prepped and draped and DuraPrep Patient monitoring: continuous pulse ox and blood pressure Approach: midline Location: L3-L4 Injection technique: LOR air  Needle:  Needle type: Tuohy  Needle gauge: 17 G Needle length: 9 cm and 9 Needle insertion depth: 5 cm cm Catheter type: closed end flexible Catheter size: 19 Gauge Catheter at skin depth: 10 cm Test dose: negative  Assessment Events: blood not aspirated, injection not painful, no injection resistance, negative IV test and no paresthesia  Additional Notes Patient identified.  Risk benefits discussed including failed block, incomplete pain control, headache, nerve damage, paralysis, blood pressure changes, nausea, vomiting, reactions to medication both toxic or allergic, and postpartum back pain.  Patient expressed understanding and wished to proceed.  All questions were answered.  Sterile technique used throughout procedure and epidural site dressed with sterile barrier dressing. No paresthesia or other complications noted.The patient did not experience any signs of intravascular injection such as tinnitus or metallic taste in mouth nor signs of intrathecal spread such as rapid motor block. Please see nursing notes for vital signs.

## 2013-06-01 NOTE — Lactation Note (Signed)
This note was copied from the chart of Boy Delora FuelJennifer Carnero. Lactation Consultation Note  Patient Name: Boy Delora FuelJennifer Scoggins MVHQI'OToday's Date: 06/01/2013 Reason for consult: Initial assessment of this second-time mother and her newborn at 409 hours of age. Mom breastfed her 35 yo for 7 months but never had "extra" milk and was even prescribed reglan to try to increase production combined with additional pumping.  This newborn has already fed 6 times since birth for up to 30 minutes on at least one breast.  Mom reports some nipple tenderness but no trauma or bleeding but her nurse has already provided comfort gelpads for her use between feedings.  Mom says she has not been able to express any colostrum yet but LC encouraged frequent STS, cue feedings and ebm prior to comfort gelpads for nipple care when she is able to express easily.  LC discussed cluster feedings and encouraged mom to alternate breasts for these frequent feedings, use breast compression during feedings to enhance milk flow and to place baby STS often for additional hormonal stimulation.  LC encouraged review of Baby and Me pp 9, 14 and 20-25 for STS and BF information. LC provided [facility] Resource brochure and reviewed [facility] services and list of community and web site resources.     Maternal Data Formula Feeding for Exclusion: No Infant to breast within first hour of birth: Yes Has patient been taught Hand Expression?: Yes (mom knows how to perform hand expression but states nothing expressible yet) Does the patient have breastfeeding experience prior to this delivery?: Yes  Feeding Feeding Type: Breast Fed Length of feed: 45 min  LATCH Score/Interventions          Comfort (Breast/Nipple): Filling, red/small blisters or bruises, mild/mod discomfort (mom reports tenderness from frequent nursing since delivery)           Lactation Tools Discussed/Used Tools: Comfort gels STS, hand expression, cue and cluster feedings  Consult  Status Consult Status: Follow-up Date: 06/02/13 Follow-up type: In-patient    Zara ChessJoanne P Esther Broyles 06/01/2013, 9:54 PM

## 2013-06-01 NOTE — Progress Notes (Signed)
[facility]  Small trickle noted Fundus massaged as uterus slightly boggy.  Patient to call for any gush of blood

## 2013-06-01 NOTE — Progress Notes (Signed)
S: notes some painful ctx SROM @ 5 am S/P cytotec  O: Pitocin 6 MIU VE 3.5/60%/-2 post left AROM clear fluid IUPC placed  Tracing: baseline 120 (+) accel 145 Ctx q 2-3 mins  IMP: Postdates Latent phase Gerd P) IV Protonix cont pitocin check contractile strength

## 2013-06-01 NOTE — Anesthesia Preprocedure Evaluation (Signed)
Anesthesia Evaluation  Patient identified by MRN, date of birth, ID band Patient awake    Reviewed: Allergy & Precautions, H&P , Patient's Chart, lab work & pertinent test results  Airway Mallampati: II TM Distance: >3 FB Neck ROM: full    Dental   Pulmonary former smoker,  breath sounds clear to auscultation        Cardiovascular Rhythm:regular Rate:Normal     Neuro/Psych    GI/Hepatic GERD-  ,  Endo/Other    Renal/GU      Musculoskeletal   Abdominal   Peds  Hematology   Anesthesia Other Findings   Reproductive/Obstetrics (+) Pregnancy                           Anesthesia Physical Anesthesia Plan  ASA: II  Anesthesia Plan: Epidural   Post-op Pain Management:    Induction:   Airway Management Planned:   Additional Equipment:   Intra-op Plan:   Post-operative Plan:   Informed Consent: I have reviewed the patients History and Physical, chart, labs and discussed the procedure including the risks, benefits and alternatives for the proposed anesthesia with the patient or authorized representative who has indicated his/her understanding and acceptance.     Plan Discussed with:   Anesthesia Plan Comments:         Anesthesia Quick Evaluation  

## 2013-06-02 ENCOUNTER — Encounter (HOSPITAL_COMMUNITY): Payer: Self-pay

## 2013-06-02 LAB — CBC
HEMATOCRIT: 33.2 % — AB (ref 36.0–46.0)
Hemoglobin: 11.2 g/dL — ABNORMAL LOW (ref 12.0–15.0)
MCH: 31 pg (ref 26.0–34.0)
MCHC: 33.7 g/dL (ref 30.0–36.0)
MCV: 92 fL (ref 78.0–100.0)
Platelets: 162 10*3/uL (ref 150–400)
RBC: 3.61 MIL/uL — AB (ref 3.87–5.11)
RDW: 13.3 % (ref 11.5–15.5)
WBC: 11.9 10*3/uL — AB (ref 4.0–10.5)

## 2013-06-02 NOTE — Lactation Note (Signed)
This note was copied from the chart of Rachael Delora FuelJennifer Schiller. Lactation Consultation Note  Patient Name: Rachael Dean'BToday's Date: 06/02/2013 Reason for consult: Follow-up assessment;Breast/nipple pain This mom breastfed her first child but has scarring of (L) nipple (crater-like depression of nipple surface) which is tender when baby latches but mom is able to latch baby on her own and after some deep-breathing and "counting" she says the pain eases a little and there is no bleeding or fresh trauma noted prior to latching.  LC suggested she apply some drops of cool water prior to latch and to start on opposite breast at every feeding, although mom prefers starting on this breast at alternate feedings.  Mom is using comfort gelpads but also has lanolin, so LC cautioned about lanolin use and reviewed some of the gelpad options available in stores, including the Medela pads which can be used with lanolin.  LC stressed need for careful handwashing and minimal application of lanolin, if desires.  LC also discussed option of mom pumping (L) while the adhesions stretch and heal, if this is more comfortable for her.   Maternal Data    Feeding Feeding Type: Breast Fed Length of feed: 15 min  LATCH Score/Interventions Latch: Grasps breast easily, tongue down, lips flanged, rhythmical sucking. Intervention(s): Breast compression  Audible Swallowing: Spontaneous and intermittent  Type of Nipple: Everted at rest and after stimulation  Comfort (Breast/Nipple): Engorged, cracked, bleeding, large blisters, severe discomfort (only (L) nippple - scars stretching from previous trauma)  Problem noted: Cracked, bleeding, blisters, bruises;Severe discomfort Interventions  (Cracked/bleeding/bruising/blister): Expressed breast milk to nipple Interventions (Mild/moderate discomfort): Comfort gels;Hand expression  Hold (Positioning): No assistance needed to correctly position infant at  breast. Intervention(s): Breastfeeding basics reviewed;Support Pillows;Position options;Skin to skin  LATCH Score: 8 (left breast with soreness but mom able to tolerate latching on this side)  Lactation Tools Discussed/Used Tools: Comfort gels   Consult Status Consult Status: Follow-up Date: 06/03/13 Follow-up type: In-patient    Zara ChessJoanne P Tericka Devincenzi 06/02/2013, 10:03 PM

## 2013-06-02 NOTE — Anesthesia Postprocedure Evaluation (Signed)
  Anesthesia Post-op Note  Anesthesia Post Note  Patient: Rachael Dean  Procedure(s) Performed: * No procedures listed *  Anesthesia type: Epidural  Patient location: Mother/Baby  Post pain: Pain level controlled  Post assessment: Post-op Vital signs reviewed  Last Vitals:  Filed Vitals:   06/02/13 0445  BP: 111/63  Pulse: 56  Temp: 36.5 C  Resp: 18    Post vital signs: Reviewed  Level of consciousness:alert  Complications: No apparent anesthesia complications

## 2013-06-02 NOTE — Progress Notes (Signed)
Patient in shower at the time of rounds.  Spouse informed that CNM will return later to do rounds.  Kenard Gowerolitta M Jessa Stinson MSN, CNM 06/02/2013 1:35 PM

## 2013-06-02 NOTE — Progress Notes (Signed)
Patient ID: Rachael FuelJennifer Grismer, female   DOB: 08/17/1980, 33 y.o.   MRN: [id number] PPD # 1 SVD  S:  Reports feeling sore, but well             Tolerating po/ No nausea or vomiting             Bleeding is light             Pain controlled with ibuprofen (OTC)             Up ad lib / ambulatory / voiding without difficulties    Newborn  Information for the patient's newborn:  Christophe Louisngram, Boy Khila [[id number]][[id number]]  female  breast feeding  / Circumcision done   O:  A & O x 3, in no apparent distress              VS:  Filed Vitals:   06/01/13 1414 06/01/13 1432 06/01/13 1520 06/02/13 0445  BP: 114/87 106/70 123/78 111/63  Pulse: 70 57 66 56  Temp:   97.6 F (36.4 C) 97.7 F (36.5 C)  TempSrc:   Oral Oral  Resp:  20 16 18   Height:      Weight:      SpO2:        LABS:  Recent Labs  05/31/13 2235 06/02/13 0618  WBC 9.1 11.9*  HGB 11.8* 11.2*  HCT 34.2* 33.2*  PLT 159 162    Blood type: O/Positive/-- (10/17 0000)  Rubella: Immune (10/17 0000)     Lungs: Clear and unlabored  Heart: regular rate and rhythm / no murmurs  Abdomen: soft, non-tender, non-distended              Fundus: firm, non-tender, U-1  Perineum: 2nd degree repair healing well, trace edema  Lochia: minimal  Extremities: no edema, no calf pain or tenderness, no Homans    A/P: PPD # 1  33 y.o., [id number]   Principal Problem:   Postpartum care following vaginal delivery (5/17) Active Problems:   Post-dates pregnancy   Doing well - stable status  Routine post partum orders  Anticipate discharge tomorrow    Kenard Gowerolitta M Danice Dippolito, MSN, CNM 06/02/2013, 2:58 PM

## 2013-06-03 MED ORDER — IBUPROFEN 600 MG PO TABS
600.0000 mg | ORAL_TABLET | Freq: Four times a day (QID) | ORAL | Status: AC
Start: 2013-06-03 — End: ?

## 2013-06-03 MED ORDER — OXYCODONE-ACETAMINOPHEN 5-325 MG PO TABS: 1.0000 | ORAL_TABLET | ORAL | Status: DC | PRN

## 2013-06-03 NOTE — Progress Notes (Signed)
PPD #2- SVD  Subjective:   Reports feeling well Tolerating po/ No nausea or vomiting Bleeding is light Pain controlled with Motrin and Percocet Up ad lib / ambulatory / voiding without problems Newborn: breastfeeding  / Circumcision: done   Objective:   VS: VS:  Filed Vitals:   06/01/13 1520 06/02/13 0445 06/02/13 1818 06/03/13 0550  BP: 123/78 111/63 99/77 115/71  Pulse: 66 56 65 57  Temp: 97.6 F (36.4 C) 97.7 F (36.5 C) 97.9 F (36.6 C) 97.7 F (36.5 C)  TempSrc: Oral Oral Oral Oral  Resp: 16 18 20 18   Height:      Weight:      SpO2:        LABS:  Recent Labs  05/31/13 2235 06/02/13 0618  WBC 9.1 11.9*  HGB 11.8* 11.2*  PLT 159 162   Blood type: O/Positive/-- (10/17 0000) Rubella: Immune (10/17 0000)                I&O: Intake/Output     05/18 0701 - 05/19 0700 05/19 0701 - 05/20 0700   Blood     Total Output       Net              Physical Exam: Alert and oriented X3 Abdomen: soft, non-tender, non-distended  Fundus: firm, non-tender, U-1 Perineum: Well approximated, no significant erythema, edema, or drainage; healing well. Lochia: small Extremities: no edema, no calf pain or tenderness    Assessment: PPD # 2 G2P2002/ S/P:induced vaginal delivery, 1st degree laceration, labial laceration Doing well - stable for discharge home   Plan: Discharge home RX's:  Ibuprofen 600mg  po Q 6 hrs prn pain #30 Refill x 0 Percocet 5/325 1 to 2 po Q 4 hrs prn pain #30 Refill x 0 Routine pp visit in [facility] booklet given    Lawernce PittsMelanie N Shemeca Lukasik MSN, CNM 06/03/2013, 9:59 AM

## 2013-06-03 NOTE — Discharge Summary (Signed)
Obstetric Discharge Summary Reason for Admission: induction of labor, postdates Prenatal Procedures: ultrasound Intrapartum Procedures: spontaneous vaginal delivery Postpartum Procedures: none Complications-Operative and Postpartum: 1st degree perineal laceration and right labial laceration Hemoglobin  Date Value Ref Range Status  06/02/2013 11.2* 12.0 - 15.0 g/dL Final     HCT  Date Value Ref Range Status  06/02/2013 33.2* 36.0 - 46.0 % Final    Physical Exam:  General: alert and cooperative Lochia: appropriate Uterine Fundus: firm Incision: healing well, no significant drainage, no dehiscence, no significant erythema DVT Evaluation: No evidence of DVT seen on physical exam. Negative Homan's sign. No cords or calf tenderness. No significant calf/ankle edema.  Discharge Diagnoses: Term Pregnancy-delivered  Discharge Information: Date: 06/03/2013 Activity: pelvic rest Diet: routine Medications: PNV, Ibuprofen and Percocet Condition: stable Instructions: refer to practice specific booklet Discharge to: home Follow-up Information   Follow up with COUSINS,SHERONETTE A, MD. Schedule an appointment as soon as possible for a visit in 6 weeks.   Specialty:  Obstetrics and Gynecology   Contact information:   [address] [place] [id number] [phone]       Newborn Data: Live born female on 06/01/13 Birth Weight: 7 lb 14.5 oz (3586 g) APGAR: 8, 9  Home with mother.  Lawernce PittsMelanie N Joshua Soulier 06/03/2013, 10:04 AM

## 2013-06-10 NOTE — Discharge Summary (Signed)
Reviewed and agree with note and plan. V.Niani Mourer, MD  

## 2013-11-17 ENCOUNTER — Encounter (HOSPITAL_COMMUNITY): Payer: Self-pay

## 2014-05-28 ENCOUNTER — Emergency Department
Admission: EM | Admit: 2014-05-28 | Discharge: 2014-05-28 | Discharge status: Absent without leave | Payer: 59 | Source: Home / Self Care

## 2015-02-27 IMAGING — CR DG CHEST 2V
2 series · 2 of 2 positions shown · non-contrast
Comparison: None.

CLINICAL DATA: Cough.  Pregnancy.

EXAM:
CHEST  2 VIEW

[w chest pa]
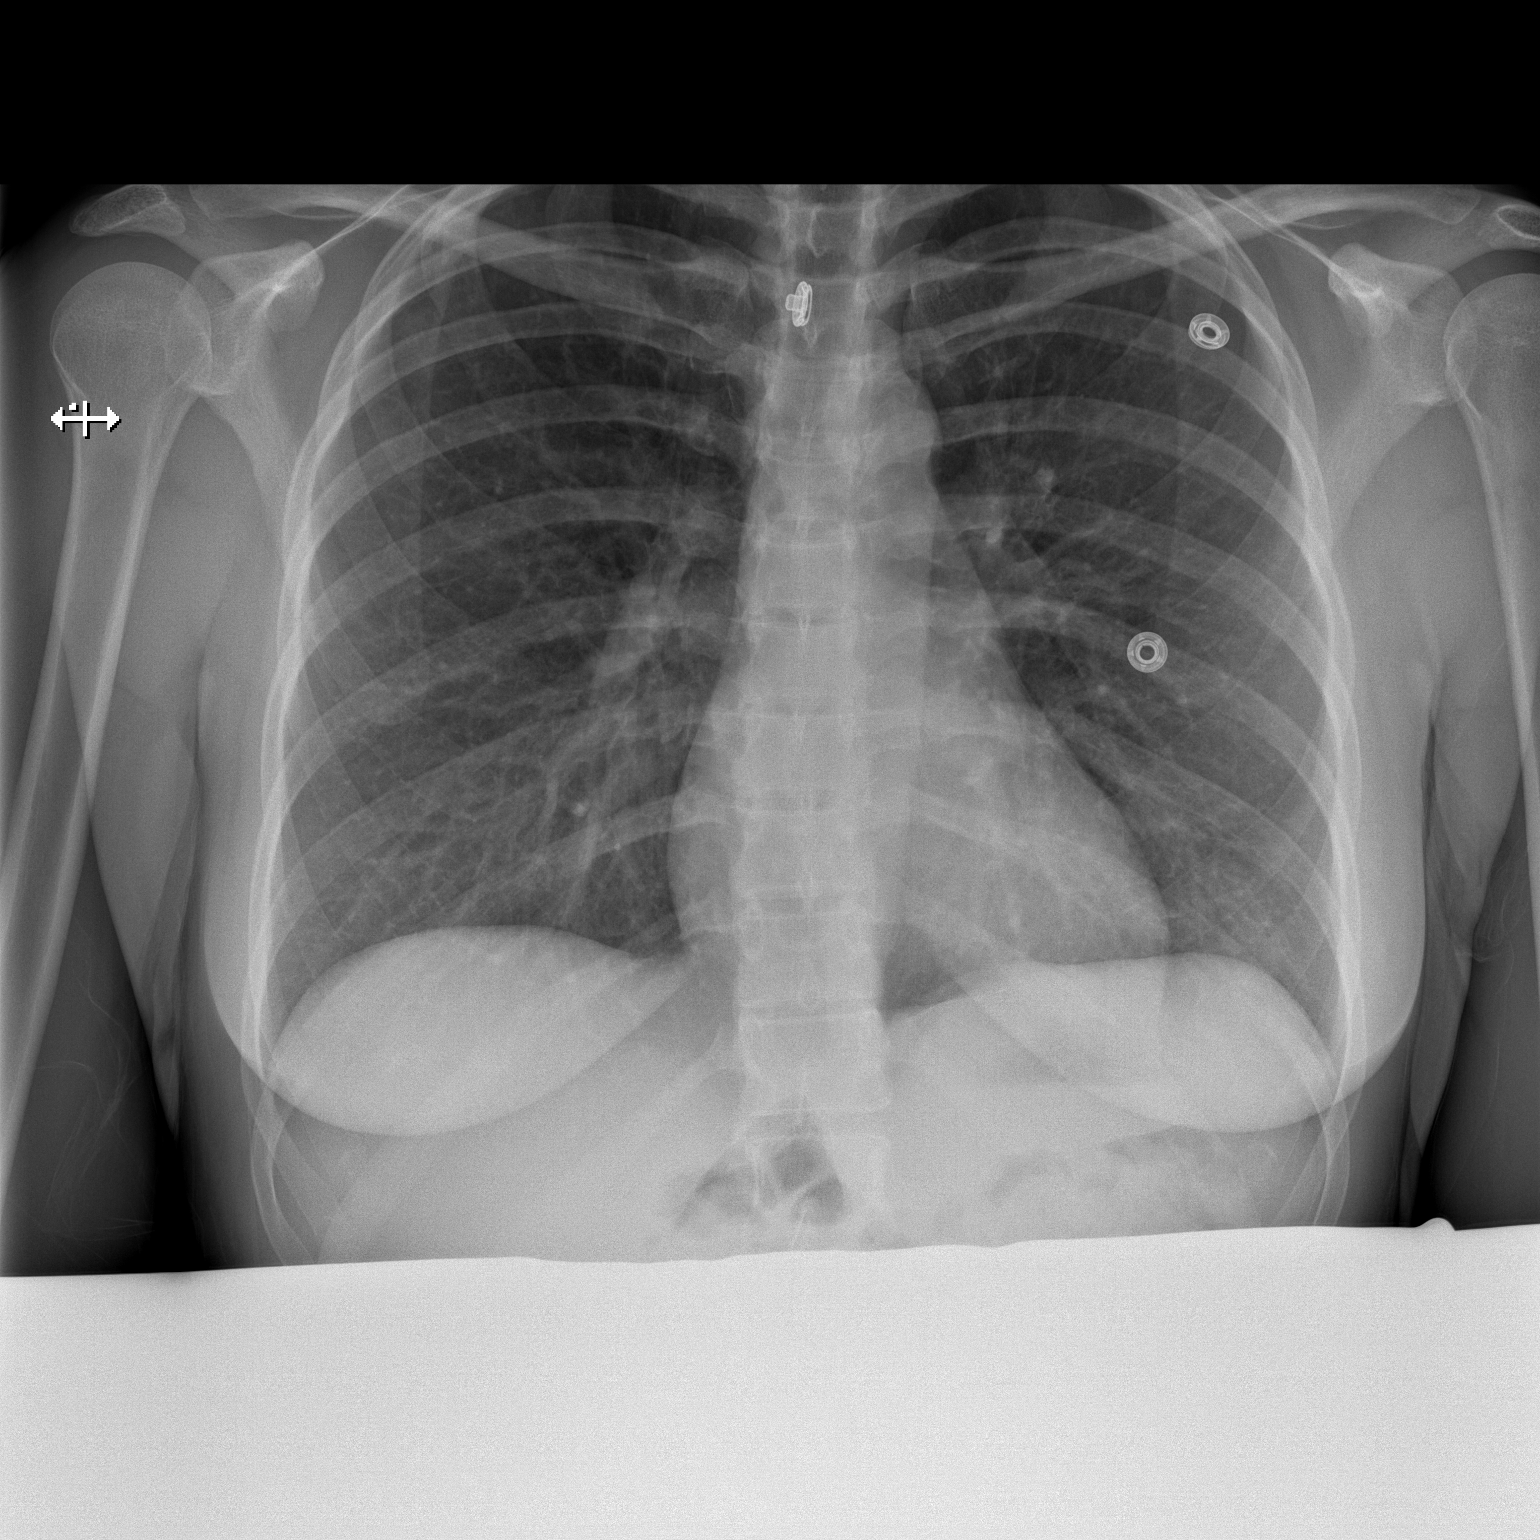

[w chest lat]
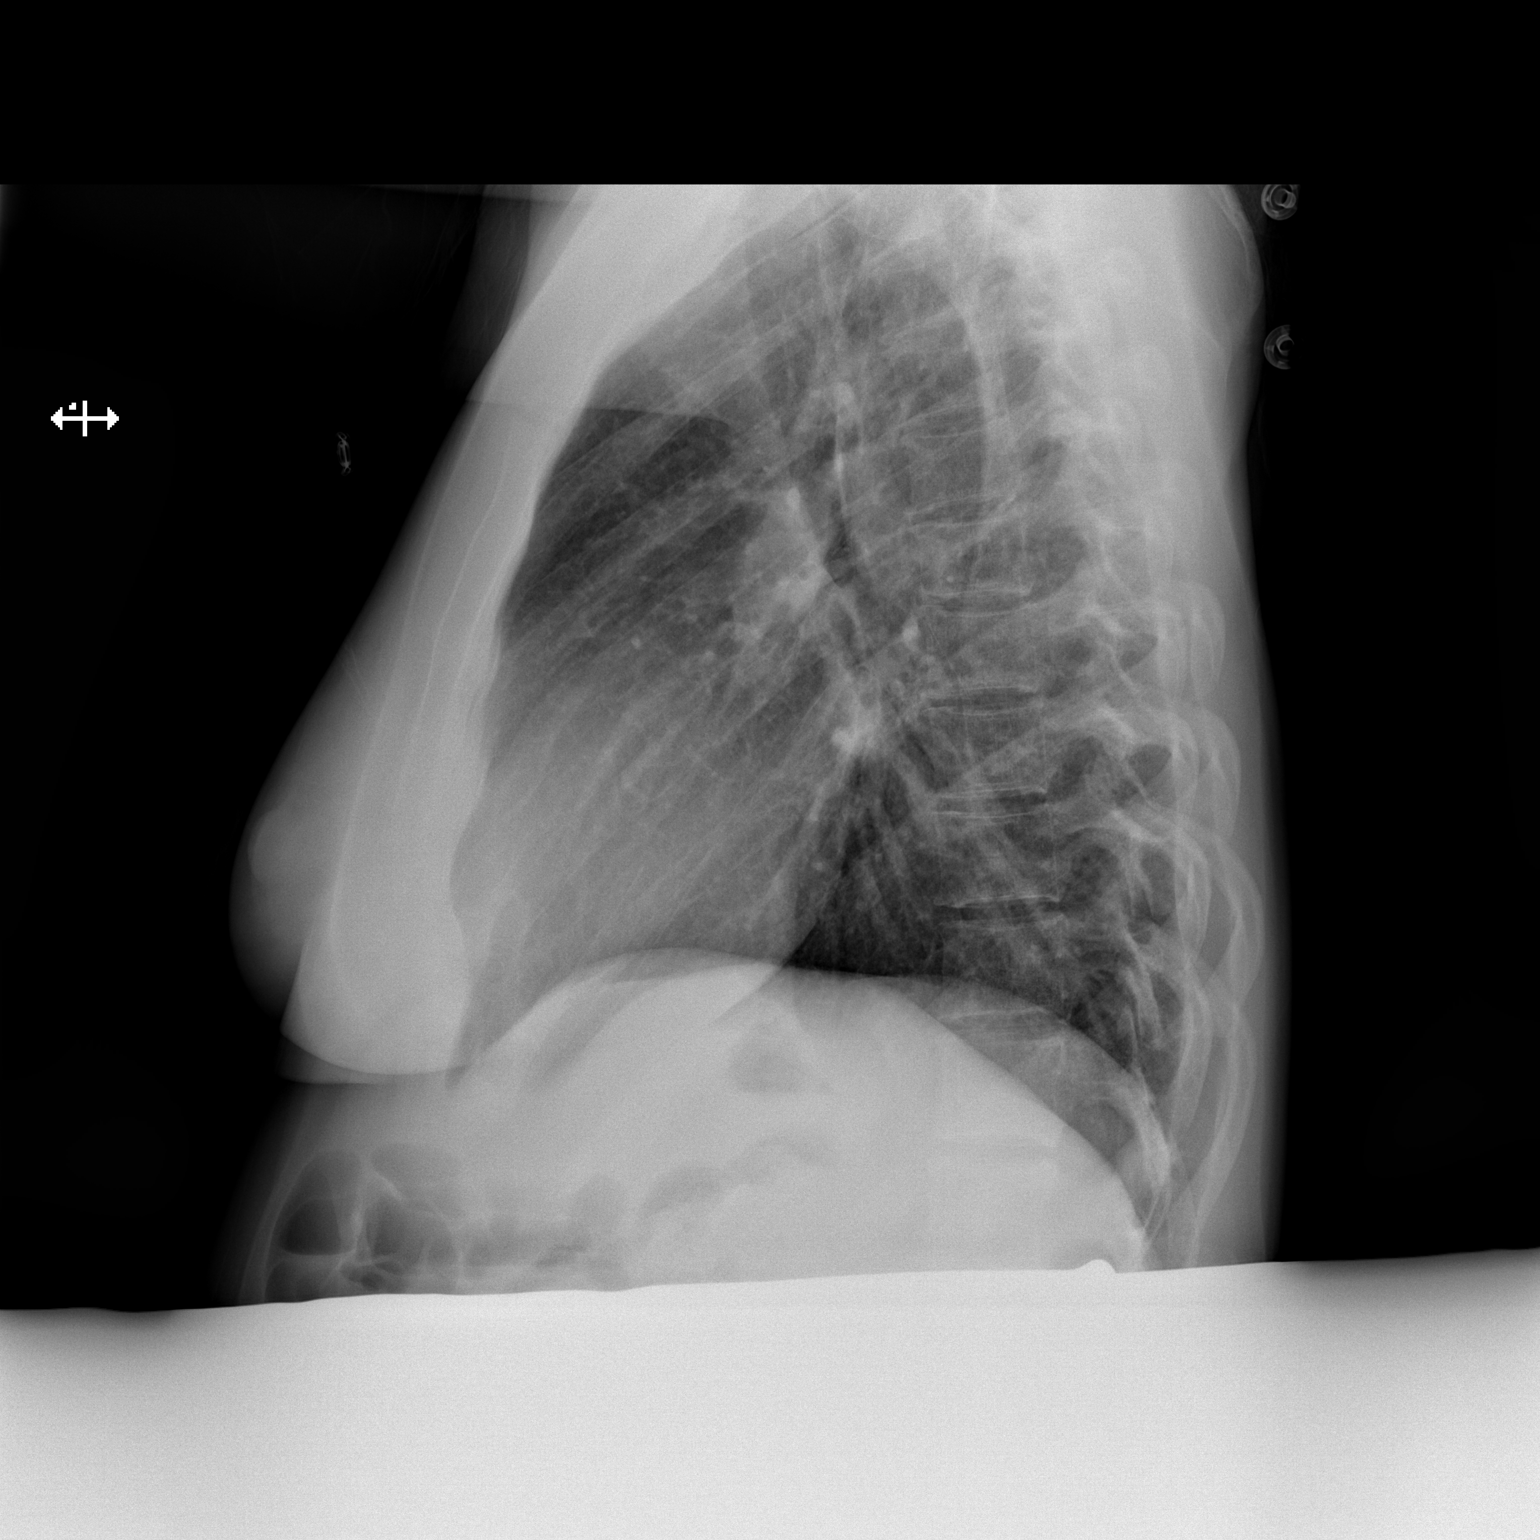

[2 of 2 positions shown; findings below may reference images not displayed]

FINDINGS: The abdomen was triple shielded due to known pregnancy.

The lungs appear clear. Cardiac and mediastinal contours normal. No
pleural effusion identified.
IMPRESSION: No active cardiopulmonary disease.

## 2017-10-15 ENCOUNTER — Other Ambulatory Visit: Payer: Self-pay | Admitting: Internal Medicine

## 2017-10-15 DIAGNOSIS — R5383 Other fatigue: Secondary | ICD-10-CM | POA: Diagnosis not present

## 2017-10-15 DIAGNOSIS — R946 Abnormal results of thyroid function studies: Secondary | ICD-10-CM | POA: Diagnosis not present

## 2017-10-15 DIAGNOSIS — D519 Vitamin B12 deficiency anemia, unspecified: Secondary | ICD-10-CM | POA: Diagnosis not present

## 2017-10-19 LAB — COMPREHENSIVE METABOLIC PANEL
A/G RATIO: 1.9 (ref 1.2–2.2)
ALT: 11 IU/L (ref 0–32)
AST: 16 IU/L (ref 0–40)
Albumin: 4.7 g/dL (ref 3.5–5.5)
Alkaline Phosphatase: 33 IU/L — ABNORMAL LOW (ref 39–117)
BILIRUBIN TOTAL: 0.6 mg/dL (ref 0.0–1.2)
BUN / CREAT RATIO: 13 (ref 9–23)
BUN: 11 mg/dL (ref 6–20)
CHLORIDE: 101 mmol/L (ref 96–106)
CO2: 24 mmol/L (ref 20–29)
Calcium: 9.6 mg/dL (ref 8.7–10.2)
Creatinine, Ser: 0.84 mg/dL (ref 0.57–1.00)
GFR calc Af Amer: 101 mL/min/{1.73_m2} (ref >59)
GFR calc non Af Amer: 88 mL/min/{1.73_m2} (ref >59)
Globulin, Total: 2.5 g/dL (ref 1.5–4.5)
Glucose: 76 mg/dL (ref 65–99)
POTASSIUM: 4.1 mmol/L (ref 3.5–5.2)
Sodium: 140 mmol/L (ref 134–144)
Total Protein: 7.2 g/dL (ref 6.0–8.5)

## 2017-10-19 LAB — THYROID ANTIBODIES
Thyroglobulin Antibody: <1 IU/mL (ref 0.0–0.9)
Thyroperoxidase Ab SerPl-aCnc: 14 IU/mL (ref 0–34)

## 2017-10-19 LAB — TESTOSTERONE: Testosterone: 21 ng/dL (ref 8–48)

## 2017-10-19 LAB — TSH: TSH: 1.29 u[IU]/mL (ref 0.450–4.500)

## 2017-10-19 LAB — TESTOSTERONE, FREE: Testosterone, Free: 0.8 pg/mL (ref 0.0–4.2)

## 2017-10-19 LAB — METHYLMALONIC ACID, SERUM: Methylmalonic Acid: 145 nmol/L (ref 0–378)

## 2017-10-19 LAB — T3, FREE: T3 FREE: 2.5 pg/mL (ref 2.0–4.4)

## 2017-10-19 LAB — PREGNENOLONE: PREGNENOLONE: 111 ng/dL

## 2017-10-19 LAB — VITAMIN B12: VITAMIN B 12: 355 pg/mL (ref 232–1245)

## 2017-10-19 LAB — T4, FREE: Free T4: 1.13 ng/dL (ref 0.82–1.77)

## 2017-10-22 NOTE — Progress Notes (Signed)
Your liver and kidney function are stable. Your serum testosterone level is within normal limits. Your vitamin B12 level is within normal limits.  Your thyroid labs are normal. I am not sure of the next step, allow me to speak to compounding pharmacist. Pls take supplements we discussed at your office visit. How are you feeling now?

## 2017-10-25 ENCOUNTER — Telehealth: Payer: Self-pay

## 2017-10-25 NOTE — Telephone Encounter (Signed)
Left a message for the pt to call back.  I was calling the pt to let her know that Dr. Allyne Gee wants her to come in for a lab appt first thing in the morning to check her Crotisol, ACTH level and to let her know that Dr. Allyne Gee plans to send her to an endocrinology.

## 2017-10-30 ENCOUNTER — Other Ambulatory Visit: Payer: 59

## 2017-10-30 DIAGNOSIS — R5383 Other fatigue: Secondary | ICD-10-CM

## 2017-10-31 LAB — ACTH: ACTH: 34.2 pg/mL (ref 7.2–63.3)

## 2017-10-31 LAB — CORTISOL: Cortisol: 17.1 ug/dL

## 2017-11-03 NOTE — Progress Notes (Signed)
pls call pt with results

## 2017-12-18 ENCOUNTER — Encounter: Payer: Self-pay | Admitting: Internal Medicine

## 2017-12-18 ENCOUNTER — Ambulatory Visit (INDEPENDENT_AMBULATORY_CARE_PROVIDER_SITE_OTHER): Payer: 59 | Admitting: Internal Medicine

## 2017-12-18 VITALS — BP 108/60 | HR 79 | Temp 97.8°F | Ht 66.0 in | Wt 135.2 lb

## 2017-12-18 DIAGNOSIS — K648 Other hemorrhoids: Secondary | ICD-10-CM | POA: Diagnosis not present

## 2017-12-18 DIAGNOSIS — K625 Hemorrhage of anus and rectum: Secondary | ICD-10-CM

## 2017-12-18 DIAGNOSIS — R195 Other fecal abnormalities: Secondary | ICD-10-CM

## 2017-12-18 LAB — POC HEMOCCULT BLD/STL (OFFICE/1-CARD/DIAGNOSTIC): Fecal Occult Blood, POC: NEGATIVE

## 2017-12-18 NOTE — Progress Notes (Signed)
  Subjective:     Patient ID: Rachael Dean , female    DOB: Jul 12, 1980 , 37 y.o.   MRN: [id number]   Chief Complaint  Patient presents with  . Rectal Pain    Patient states she was having rectal pain and did notice some bleeding. She has been having nausea and changes in her bowel movements and she stated she came in about a month ago and she noticed she was having some stomach pain but it wasnt the usual pain.  . Anxiety    HPI Has been having anal pain and bleeding off and on x 2 days ago. Has not felt any masses.  Has been having nausea, having in bowel habits with more diarrhea x 3-5 months, and lst week noticed thin stool shape for a few days. In the past month has had lower abd cramps. No colon issues in her family,    Past Medical History:  Diagnosis Date  . GERD (gastroesophageal reflux disease)   . IBS (irritable bowel syndrome)   . Postpartum care following vaginal delivery (5/17) 06/01/2013     Family History  Problem Relation Age of Onset  . Diabetes Father   . Birth defects Paternal Aunt        congenital brain anomaly  . Hypertension Paternal Grandmother      Current Outpatient Medications:  .  ibuprofen (ADVIL,MOTRIN) 600 MG tablet, Take 1 tablet (600 mg total) by mouth every 6 (six) hours., Disp: 30 tablet, Rfl: 0 .  Probiotic Product (PROBIOTIC DAILY PO), Take 2 capsules by mouth daily., Disp: , Rfl:    Allergies  Allergen Reactions  . Minocycline Nausea And Vomiting and Rash     Review of Systems  Constitutional: Negative for unexpected weight change.       Has noticed mild wt loss, but knows she has not been eating as usual, due to nausea off and on  Gastrointestinal: Positive for abdominal pain, blood in stool, diarrhea and nausea. Negative for vomiting.  Skin: Negative for rash.     Today's Vitals   12/18/17 0944  BP: 108/60  Pulse: 79  Temp: 97.8 F (36.6 C)  TempSrc: Oral  SpO2: 95%  Weight: 135 lb 3.2 oz (61.3 kg)  Height: '5\' 6"'$  (1.676  m)  PainSc: 0-No pain   Body mass index is 21.82 kg/m.   Objective:  Physical Exam  Constitutional: She appears well-developed and well-nourished. No distress.  HENT:  Head: Normocephalic.  Right Ear: External ear normal.  Left Ear: External ear normal.  Nose: Nose normal.  Eyes: Conjunctivae are normal. No scleral icterus.  Neck: Neck supple.  Abdominal: Soft. Bowel sounds are normal. She exhibits no distension and no mass. There is no tenderness. There is no rebound and no guarding.  RECTAL- external area is normal, inner annal region with mild internal hemorrhoids, no masses( she has a tampon in vagina) small pellet stool present and hemoccult is neg.   Skin: She is not diaphoretic.  Nursing note and vitals reviewed.   Assessment And Plan:   1. Change in stool caliber- new - Ambulatory referral to Gastroenterology  2. Rectal bleeding- may have been from internal hemorrhoids, but due to change in stool habit and caliber I sent her to GI.  - POC Hemoccult Bld/Stl (1-Cd Office Dx)  FU prn with her provider.   Iley Deignan RODRIGUEZ-SOUTHWORTH, PA-C

## 2018-03-22 ENCOUNTER — Encounter: Payer: Self-pay | Admitting: Internal Medicine

## 2018-07-15 ENCOUNTER — Telehealth: Payer: Self-pay

## 2018-07-15 ENCOUNTER — Other Ambulatory Visit: Payer: Self-pay

## 2018-07-15 ENCOUNTER — Ambulatory Visit (INDEPENDENT_AMBULATORY_CARE_PROVIDER_SITE_OTHER): Payer: 59 | Admitting: Internal Medicine

## 2018-07-15 ENCOUNTER — Encounter: Payer: Self-pay | Admitting: Internal Medicine

## 2018-07-15 VITALS — Ht 66.0 in

## 2018-07-15 DIAGNOSIS — R5383 Other fatigue: Secondary | ICD-10-CM | POA: Diagnosis not present

## 2018-07-15 DIAGNOSIS — B349 Viral infection, unspecified: Secondary | ICD-10-CM | POA: Diagnosis not present

## 2018-07-15 DIAGNOSIS — R52 Pain, unspecified: Secondary | ICD-10-CM

## 2018-07-15 DIAGNOSIS — R059 Cough, unspecified: Secondary | ICD-10-CM

## 2018-07-15 DIAGNOSIS — R05 Cough: Secondary | ICD-10-CM | POA: Diagnosis not present

## 2018-07-15 DIAGNOSIS — M791 Myalgia, unspecified site: Secondary | ICD-10-CM | POA: Diagnosis not present

## 2018-07-15 NOTE — Telephone Encounter (Signed)
Hello,  Dr. Sanders would like the pt to be contacted and scheduled an appt to be tested for the coronavirus.   Thank you. 

## 2018-07-21 ENCOUNTER — Encounter: Payer: Self-pay | Admitting: Internal Medicine

## 2018-07-21 NOTE — Progress Notes (Signed)
Virtual Visit via Video   This visit type was conducted due to national recommendations for restrictions regarding the COVID-19 Pandemic (e.g. social distancing) in an effort to limit this patient's exposure and mitigate transmission in our community.  Due to her co-morbid illnesses, this patient is at least at moderate risk for complications without adequate follow up.  This format is felt to be most appropriate for this patient at this time.  All issues noted in this document were discussed and addressed.  A limited physical exam was performed with this format.    This visit type was conducted due to national recommendations for restrictions regarding the COVID-19 Pandemic (e.g. social distancing) in an effort to limit this patient's exposure and mitigate transmission in our community.  Patients identity confirmed using two different identifiers.  This format is felt to be most appropriate for this patient at this time.  All issues noted in this document were discussed and addressed.  No physical exam was performed (except for noted visual exam findings with Video Visits).    Date:  07/21/2018   ID:  Rachael Dean, DOB 29-Dec-1980, MRN [id number]  Patient Location:  Home, accompanied by her husband  Provider location:   Office    Chief Complaint:  I want to be tested for COVID-19/   History of Present Illness:    Rachael Dean is a 38 y.o. female who presents via video conferencing for a telehealth visit today.    The patient does have symptoms concerning for COVID-19 infection (fever, chills, cough, or new shortness of breath).   She presents today for virtual visit. She prefers this method of contact due to COVID-19 pandemic. She requested this appointment today because she wants to get tested for COVID-19. She reports that she has been experiencing bodyaches, dry cough, fatigue and runny nose. She denies having a fever. She has checked her temperature. Of note, she adds that she was  recently started on zoloft by her GYN. She was started on 50mg  daily. Her symptoms started shortly after starting this medication. She denies known exposure to anyone with COVID-19. She denies recent travel.     Past Medical History:  Diagnosis Date  . GERD (gastroesophageal reflux disease)   . IBS (irritable bowel syndrome)   . Postpartum care following vaginal delivery (5/17) 06/01/2013   Past Surgical History:  Procedure Laterality Date  . INTESTINAL MALROTATION REPAIR    . KNEE SURGERY    . MANDIBLE SURGERY       Current Meds  Medication Sig  . ibuprofen (ADVIL,MOTRIN) 600 MG tablet Take 1 tablet (600 mg total) by mouth every 6 (six) hours.  . Probiotic Product (PROBIOTIC DAILY PO) Take 2 capsules by mouth daily.     Allergies:   Minocycline   Social History   Tobacco Use  . Smoking status: Never Smoker  . Smokeless tobacco: Never Used  Substance Use Topics  . Alcohol use: No    Comment: pregnant  . Drug use: No     Family Hx: The patient's family history includes Birth defects in her paternal aunt; Diabetes in her father; Healthy in her mother; Hypertension in her paternal grandmother.  ROS:   Please see the history of present illness.    Review of Systems  Constitutional: Negative.   Respiratory: Positive for cough.   Cardiovascular: Negative.   Gastrointestinal: Negative.   Musculoskeletal: Positive for myalgias.  Neurological: Negative.   Psychiatric/Behavioral: Negative.     All other systems reviewed and  are negative.   Labs/Other Tests and Data Reviewed:    Recent Labs: 10/15/2017: ALT 11; BUN 11; Creatinine, Ser 0.84; Potassium 4.1; Sodium 140; TSH 1.290   Recent Lipid Panel No results found for: CHOL, TRIG, HDL, CHOLHDL, LDLCALC, LDLDIRECT  Wt Readings from Last 3 Encounters:  12/18/17 135 lb 3.2 oz (61.3 kg)  05/31/13 161 lb (73 kg)  03/08/10 145 lb (65.8 kg)     Exam:    Vital Signs:  Ht 5\' 6"  (1.676 m)   LMP  (LMP Unknown)   BMI  21.82 kg/m     Physical Exam  Constitutional: She is oriented to person, place, and time and well-developed, well-nourished, and in no distress.  HENT:  Head: Normocephalic and atraumatic.  Neck: Normal range of motion.  Pulmonary/Chest: Effort normal.  Neurological: She is alert and oriented to person, place, and time.  Psychiatric: Affect normal.  Nursing note and vitals reviewed.   ASSESSMENT & PLAN:     1. Viral illness  She was advised to take Oscillococcinum, a homeopathic remedy. She is advised that she can get this over the counter.   2. Cough  [facility]. She is encouraged to self-isolate until her results are available. Greater than 50% of face to face time was spent in counseling and coordination of care. I communicated with both the patient and her husband for greater than 25 minutes. All questions were answered to their satisfaction.    3. Generalized body aches  Pt was advised that her symptoms could possibly be due to the zoloft she was recently started on by her GYN. She was advised to cut back to 1/2 pill (25mg ) daily x 7 days, and then increase back to 50mg  in seven days. If she has any other issues with the medication, she will f/u with GYN.   COVID-19 Education: The signs and symptoms of COVID-19 were discussed with the patient and how to seek care for testing (follow up with PCP or arrange E-visit).  The importance of social distancing was discussed today.  Patient Risk:   After full review of this patients clinical status, I feel that they are at least moderate risk at this time.  Time:   Today, I have spent 28 minutes/ 10 seconds with the patient with telehealth technology discussing above diagnoses.     Medication Adjustments/Labs and Tests Ordered: Current medicines are reviewed at length with the patient today.  Concerns regarding medicines are outlined above.   Tests Ordered: No orders of the defined types  were placed in this encounter.   Medication Changes: No orders of the defined types were placed in this encounter.   Disposition:  Follow up prn  Signed, Gwynneth Alimentobyn N Neythan Kozlov, MD

## 2019-09-03 DIAGNOSIS — Z20822 Contact with and (suspected) exposure to covid-19: Secondary | ICD-10-CM | POA: Diagnosis not present

## 2019-09-03 DIAGNOSIS — R0981 Nasal congestion: Secondary | ICD-10-CM | POA: Diagnosis not present

## 2019-09-03 DIAGNOSIS — J029 Acute pharyngitis, unspecified: Secondary | ICD-10-CM | POA: Diagnosis not present

## 2019-09-03 DIAGNOSIS — R519 Headache, unspecified: Secondary | ICD-10-CM | POA: Diagnosis not present

## 2019-10-06 DIAGNOSIS — Z1231 Encounter for screening mammogram for malignant neoplasm of breast: Secondary | ICD-10-CM | POA: Diagnosis not present

## 2019-10-06 DIAGNOSIS — Z01419 Encounter for gynecological examination (general) (routine) without abnormal findings: Secondary | ICD-10-CM | POA: Diagnosis not present

## 2019-10-06 DIAGNOSIS — Z6822 Body mass index (BMI) 22.0-22.9, adult: Secondary | ICD-10-CM | POA: Diagnosis not present

## 2019-10-06 DIAGNOSIS — Z1151 Encounter for screening for human papillomavirus (HPV): Secondary | ICD-10-CM | POA: Diagnosis not present

## 2019-10-06 DIAGNOSIS — F419 Anxiety disorder, unspecified: Secondary | ICD-10-CM | POA: Diagnosis not present

## 2019-10-15 DIAGNOSIS — Z13220 Encounter for screening for lipoid disorders: Secondary | ICD-10-CM | POA: Diagnosis not present

## 2019-10-15 DIAGNOSIS — Z0000 Encounter for general adult medical examination without abnormal findings: Secondary | ICD-10-CM | POA: Diagnosis not present

## 2019-10-15 DIAGNOSIS — Z131 Encounter for screening for diabetes mellitus: Secondary | ICD-10-CM | POA: Diagnosis not present

## 2019-10-15 DIAGNOSIS — Z1329 Encounter for screening for other suspected endocrine disorder: Secondary | ICD-10-CM | POA: Diagnosis not present

## 2019-10-15 DIAGNOSIS — Z130 Encounter for screening for diseases of the blood and blood-forming organs and certain disorders involving the immune mechanism: Secondary | ICD-10-CM | POA: Diagnosis not present

## 2019-12-18 DIAGNOSIS — R0981 Nasal congestion: Secondary | ICD-10-CM | POA: Diagnosis not present

## 2019-12-18 DIAGNOSIS — R059 Cough, unspecified: Secondary | ICD-10-CM | POA: Diagnosis not present

## 2019-12-18 DIAGNOSIS — R519 Headache, unspecified: Secondary | ICD-10-CM | POA: Diagnosis not present

## 2021-09-01 ENCOUNTER — Other Ambulatory Visit: Payer: Self-pay

## 2021-09-01 ENCOUNTER — Emergency Department (HOSPITAL_BASED_OUTPATIENT_CLINIC_OR_DEPARTMENT_OTHER): Payer: Managed Care, Other (non HMO)

## 2021-09-01 ENCOUNTER — Emergency Department (HOSPITAL_BASED_OUTPATIENT_CLINIC_OR_DEPARTMENT_OTHER)
Admission: EM | Admit: 2021-09-01 | Discharge: 2021-09-01 | Disposition: A | Discharge status: Home / Self Care | Payer: Managed Care, Other (non HMO) | Attending: Emergency Medicine | Admitting: Emergency Medicine

## 2021-09-01 ENCOUNTER — Encounter (HOSPITAL_BASED_OUTPATIENT_CLINIC_OR_DEPARTMENT_OTHER): Payer: Self-pay | Admitting: Emergency Medicine

## 2021-09-01 DIAGNOSIS — R519 Headache, unspecified: Secondary | ICD-10-CM | POA: Diagnosis present

## 2021-09-01 DIAGNOSIS — R110 Nausea: Secondary | ICD-10-CM | POA: Diagnosis not present

## 2021-09-01 DIAGNOSIS — Z20822 Contact with and (suspected) exposure to covid-19: Secondary | ICD-10-CM | POA: Insufficient documentation

## 2021-09-01 DIAGNOSIS — R202 Paresthesia of skin: Secondary | ICD-10-CM | POA: Diagnosis not present

## 2021-09-01 LAB — SARS CORONAVIRUS 2 BY RT PCR: SARS Coronavirus 2 by RT PCR: NEGATIVE

## 2021-09-01 MED ORDER — ONDANSETRON 4 MG PO TBDP
8.0000 mg | ORAL_TABLET | Freq: Once | ORAL | Status: AC
  Administered 2021-09-01: 8 mg via ORAL
  Filled 2021-09-01: qty 2

## 2021-09-01 MED ORDER — IBUPROFEN 400 MG PO TABS
400.0000 mg | ORAL_TABLET | Freq: Once | ORAL | Status: DC
  Filled 2021-09-01: qty 1

## 2021-09-01 NOTE — ED Provider Notes (Signed)
[facility] EMERGENCY DEPT Provider Note   CSN: [id number] Arrival date & time: 09/01/21  0938     History  Chief Complaint  Patient presents with   Headache    Rachael Dean is a 41 y.o. female.  Pt c/o dull frontal headache in the past couple days. Symptoms gradual onset, not severe/abrupt/acute at onset, mild-mod, non radiating, without specific exacerbating or alleviating factors. No unilateral, throbbing type pain. No debilitating or severe pain. States had gone to a waterpark/pond area the day before, and that night became very worried/concerned about 'what if' her children got brain amoeba. Patient indicates had transient numbness/tingling in bilateral hands/fingers, but denies currently - no neck or radicular pain. Denies any recent head trauma. No syncope. No sinus drainage, pain or congestion. No neck stiffness. No vomiting. No loss of normal functional ability. Went to pcp yesterday for same, was given meds for headache. No hx chronic headaches/migraines. Pt has been able to sleep fine at night.   The history is provided by the patient, medical records and the spouse.  Headache Associated symptoms: no abdominal pain, no back pain, no eye pain, no fever, no neck pain, no neck stiffness, no sore throat and no vomiting        Home Medications Prior to Admission medications   Medication Sig Start Date End Date Taking? Authorizing Provider  ibuprofen (ADVIL,MOTRIN) 600 MG tablet Take 1 tablet (600 mg total) by mouth every 6 (six) hours. 06/03/13   Donette Larry, CNM  Probiotic Product (PROBIOTIC DAILY PO) Take 2 capsules by mouth daily.    [provider]      Allergies    Minocycline    Review of Systems   Review of Systems  Constitutional:  Negative for chills and fever.  HENT:  Negative for sinus pain and sore throat.   Eyes:  Negative for pain and redness.  Respiratory:  Negative for shortness of breath.   Cardiovascular:  Negative for  chest pain.  Gastrointestinal:  Negative for abdominal pain and vomiting.  Genitourinary:  Negative for flank pain.  Musculoskeletal:  Negative for back pain, neck pain and neck stiffness.  Skin:  Negative for rash.  Neurological:  Positive for headaches. Negative for speech difficulty.  Hematological:  Does not bruise/bleed easily.  Psychiatric/Behavioral:  Negative for confusion.     Physical Exam Updated Vital Signs BP 105/74   Pulse 64   Temp 98.5 F (36.9 C) (Oral)   Resp 18   Ht 1.664 m (5' 5.5")   Wt 61.2 kg   SpO2 99%   BMI 22.12 kg/m  Physical Exam Vitals and nursing note reviewed.  Constitutional:      Appearance: Normal appearance. She is well-developed.  HENT:     Head: Atraumatic.     Comments: No sinus or temporal tenderness.     Nose: Nose normal.     Mouth/Throat:     Mouth: Mucous membranes are moist.     Pharynx: Oropharynx is clear.  Eyes:     General: No scleral icterus.    Conjunctiva/sclera: Conjunctivae normal.     Pupils: Pupils are equal, round, and reactive to light.  Neck:     Trachea: No tracheal deviation.     Comments: No stiffness or rigidity.  Cardiovascular:     Rate and Rhythm: Normal rate and regular rhythm.     Pulses: Normal pulses.     Heart sounds: Normal heart sounds. No murmur heard.    No friction rub.  No gallop.  Pulmonary:     Effort: Pulmonary effort is normal. No respiratory distress.     Breath sounds: Normal breath sounds.  Abdominal:     General: There is no distension.     Tenderness: There is no abdominal tenderness.  Genitourinary:    Comments: No cva tenderness.  Musculoskeletal:        General: No swelling or tenderness.     Cervical back: Normal range of motion and neck supple. No rigidity. No muscular tenderness.  Skin:    General: Skin is warm and dry.     Findings: No rash.  Neurological:     Mental Status: She is alert.     Comments: Alert, speech normal. Motor/sens grossly intact bil. Steady gait.    Psychiatric:        Mood and Affect: Mood normal.     ED Results / Procedures / Treatments   Labs (all labs ordered are listed, but only abnormal results are displayed) Results for orders placed or performed during the hospital encounter of 09/01/21  SARS Coronavirus 2 by RT PCR (hospital order, performed in [facility] hospital lab) *cepheid single result test* Anterior Nasal Swab   Specimen: Anterior Nasal Swab  Result Value Ref Range   SARS Coronavirus 2 by RT PCR NEGATIVE NEGATIVE   CT Head Wo Contrast  Result Date: 09/01/2021 CLINICAL DATA:  Headache and blurry vision. EXAM: CT HEAD WITHOUT CONTRAST TECHNIQUE: Contiguous axial images were obtained from the base of the skull through the vertex without intravenous contrast. RADIATION DOSE REDUCTION: This exam was performed according to the departmental dose-optimization program which includes automated exposure control, adjustment of the mA and/or kV according to patient size and/or use of iterative reconstruction technique. COMPARISON:  None Available. FINDINGS: Brain: No evidence of acute infarction, hemorrhage, hydrocephalus, extra-axial collection or mass lesion/mass effect. Vascular: No hyperdense vessel or unexpected calcification. Skull: Normal. Negative for fracture or focal lesion. Sinuses/Orbits: No acute finding. Other: None. IMPRESSION: 1. No acute intracranial abnormality. Electronically Signed   By: Obie Dredge M.D.   On: 09/01/2021 09:48     EKG None  Radiology CT Head Wo Contrast  Result Date: 09/01/2021 CLINICAL DATA:  Headache and blurry vision. EXAM: CT HEAD WITHOUT CONTRAST TECHNIQUE: Contiguous axial images were obtained from the base of the skull through the vertex without intravenous contrast. RADIATION DOSE REDUCTION: This exam was performed according to the departmental dose-optimization program which includes automated exposure control, adjustment of the mA and/or kV according to patient size and/or use of  iterative reconstruction technique. COMPARISON:  None Available. FINDINGS: Brain: No evidence of acute infarction, hemorrhage, hydrocephalus, extra-axial collection or mass lesion/mass effect. Vascular: No hyperdense vessel or unexpected calcification. Skull: Normal. Negative for fracture or focal lesion. Sinuses/Orbits: No acute finding. Other: None. IMPRESSION: 1. No acute intracranial abnormality. Electronically Signed   By: Obie Dredge M.D.   On: 09/01/2021 09:48    Procedures Procedures    Medications Ordered in ED Medications - No data to display  ED Course/ Medical Decision Making/ A&P                           Medical Decision Making Problems Addressed: Frontal headache: acute illness or injury with systemic symptoms Nausea: acute illness or injury with systemic symptoms Paresthesia: acute illness or injury with systemic symptoms  Amount and/or Complexity of Data Reviewed Independent Historian: spouse    Details: hx External Data Reviewed: notes.  Labs: ordered. Decision-making details documented in ED Course. Radiology: ordered and independent interpretation performed. Decision-making details documented in ED Course.  Risk OTC drugs. Prescription drug management. Decision regarding hospitalization.  Labs ordered/sent. Imaging ordered.   Diff dx includes ICH, sinusitis/infxn, non-spec headache, tension headache, viral illness, etc. - dispo decision including potential need for admission if ct positive/abn considered - will get labs and imaging and reassess.   Reviewed nursing notes and prior charts for additional history. External reports reviewed. Hx anxiety noted. Additional history from: spouse.   Labs reviewed/interpreted by me - covid neg.   CT reviewed/interpreted by me - no hem.  Zofran po. Ibuprofen po. Po fluids.   Pt overall is very well, non-toxic appearing, and appears stable for d/c. Mar[facility] pcp f/u.  Return precautions provided.           Final Clinical Impression(s) / ED Diagnoses Final diagnoses:  Frontal headache    Rx / DC Orders ED Discharge Orders     None         Cathren Laine, MD 09/01/21 1037

## 2021-09-01 NOTE — ED Notes (Signed)
Given  water  to drink

## 2021-09-01 NOTE — Discharge Instructions (Addendum)
It was our pleasure to provide your ER care today - we hope that you feel better.  Your covid test is negative, and your head CT looks good or normal.   Rest. Drink plenty of fluids/stay well hydrated.   Take acetaminophen, ibuprofen, or excedrin as need for headache.   Follow up closely with primary care doctor in the coming week if symptoms fail to improve/resolve.  Return to ER if worse, new symptoms, acutely worsening/severe headache, persistent vomiting, high fevers, or other concern.

## 2021-09-01 NOTE — ED Triage Notes (Addendum)
Pt arrives to ED with c/o headache, neck pain, nausea, dizziness, blurry vision, numbness and tingling. Pt states on 8/14 she reported being in an "inflatable body of water/obstacle course" containing pond/lake water and the headache/neck pain/nausea started the very next day. She reports today she developed numbness and tingling in her hands and fingers accompanied by blurry vision. She reports that her symptoms have continued to worsen. She reports she is worried about a "brain eating amoeba." Headache is described as a constant dull/achy/pressure. She denies fevers. Was seen by PCP Erin, yesterday and received Toradol IM for possible migraine headache and educated on dehydration. Husband w/o symptoms. No reported fevers or diarrhea.
